# Patient Record
Sex: Female | Born: 1965 | Race: Black or African American | Hispanic: No | Marital: Married | Smoking: Never smoker
Health system: Southern US, Community
[De-identification: ages and names within clinical notes are randomized; demographics above are authoritative.]

## PROBLEM LIST (undated history)

## (undated) DIAGNOSIS — J40 Bronchitis, not specified as acute or chronic: Secondary | ICD-10-CM

## (undated) DIAGNOSIS — E0500 Thyrotoxicosis with diffuse goiter without thyrotoxic crisis or storm: Secondary | ICD-10-CM

## (undated) DIAGNOSIS — R0602 Shortness of breath: Secondary | ICD-10-CM

## (undated) DIAGNOSIS — E039 Hypothyroidism, unspecified: Secondary | ICD-10-CM

## (undated) DIAGNOSIS — J449 Chronic obstructive pulmonary disease, unspecified: Secondary | ICD-10-CM

## (undated) HISTORY — PX: ANKLE SURGERY: SHX546

---

## 2007-11-14 HISTORY — PX: APPENDECTOMY: SHX54

## 2007-11-14 HISTORY — PX: ABDOMINAL HYSTERECTOMY: SHX81

## 2008-12-20 ENCOUNTER — Emergency Department (HOSPITAL_BASED_OUTPATIENT_CLINIC_OR_DEPARTMENT_OTHER): Admission: EM | Admit: 2008-12-20 | Discharge: 2008-12-21 | Payer: Self-pay | Admitting: Emergency Medicine

## 2008-12-21 ENCOUNTER — Ambulatory Visit: Payer: Self-pay | Admitting: Diagnostic Radiology

## 2009-12-24 ENCOUNTER — Emergency Department (HOSPITAL_BASED_OUTPATIENT_CLINIC_OR_DEPARTMENT_OTHER): Admission: EM | Admit: 2009-12-24 | Discharge: 2009-12-24 | Payer: Self-pay | Admitting: Emergency Medicine

## 2010-11-06 ENCOUNTER — Emergency Department (HOSPITAL_BASED_OUTPATIENT_CLINIC_OR_DEPARTMENT_OTHER)
Admission: EM | Admit: 2010-11-06 | Discharge: 2010-11-06 | Payer: Self-pay | Source: Home / Self Care | Admitting: Emergency Medicine

## 2011-01-06 LAB — WOUND CULTURE

## 2011-01-23 LAB — POCT CARDIAC MARKERS
CKMB, poc: <1 ng/mL — ABNORMAL LOW (ref 1.0–8.0)
CKMB, poc: <1 ng/mL — ABNORMAL LOW (ref 1.0–8.0)
Myoglobin, poc: 62.3 ng/mL (ref 12–200)
Troponin i, poc: <0.05 ng/mL (ref 0.00–0.09)

## 2011-01-23 LAB — URINALYSIS, ROUTINE W REFLEX MICROSCOPIC
Hgb urine dipstick: NEGATIVE
Ketones, ur: 15 mg/dL — AB
Protein, ur: NEGATIVE mg/dL
Urobilinogen, UA: 1 mg/dL (ref 0.0–1.0)

## 2011-01-23 LAB — BASIC METABOLIC PANEL
Calcium: 9.1 mg/dL (ref 8.4–10.5)
GFR calc Af Amer: >60 mL/min (ref >60)
GFR calc non Af Amer: >60 mL/min (ref >60)
Glucose, Bld: 86 mg/dL (ref 70–99)
Sodium: 138 mEq/L (ref 135–145)

## 2011-01-23 LAB — URINE CULTURE

## 2011-09-07 ENCOUNTER — Emergency Department (HOSPITAL_BASED_OUTPATIENT_CLINIC_OR_DEPARTMENT_OTHER)
Admission: EM | Admit: 2011-09-07 | Discharge: 2011-09-07 | Disposition: A | Discharge status: Home / Self Care | Attending: Emergency Medicine | Admitting: Emergency Medicine

## 2011-09-07 ENCOUNTER — Encounter: Payer: Self-pay | Admitting: *Deleted

## 2011-09-07 DIAGNOSIS — J45909 Unspecified asthma, uncomplicated: Secondary | ICD-10-CM | POA: Insufficient documentation

## 2011-09-07 DIAGNOSIS — R0602 Shortness of breath: Secondary | ICD-10-CM | POA: Insufficient documentation

## 2011-09-07 DIAGNOSIS — J209 Acute bronchitis, unspecified: Secondary | ICD-10-CM | POA: Insufficient documentation

## 2011-09-07 HISTORY — DX: Thyrotoxicosis with diffuse goiter without thyrotoxic crisis or storm: E05.00

## 2011-09-07 MED ORDER — ALBUTEROL SULFATE (5 MG/ML) 0.5% IN NEBU
2.5000 mg | INHALATION_SOLUTION | Freq: Once | RESPIRATORY_TRACT | Status: AC
  Administered 2011-09-07: 2.5 mg via RESPIRATORY_TRACT

## 2011-09-07 MED ORDER — HYDROCOD POLST-CHLORPHEN POLST 10-8 MG/5ML PO LQCR: 5.0000 mL | Freq: Two times a day (BID) | ORAL | Status: DC | PRN

## 2011-09-07 MED ORDER — ALBUTEROL SULFATE (5 MG/ML) 0.5% IN NEBU
INHALATION_SOLUTION | RESPIRATORY_TRACT | Status: AC
  Filled 2011-09-07: qty 0.5

## 2011-09-07 MED ORDER — AZITHROMYCIN 250 MG PO TABS: 250.0000 mg | ORAL_TABLET | Freq: Every day | ORAL | Status: AC

## 2011-09-07 NOTE — ED Notes (Signed)
Pt c/o cough, congestion, SHOB since last Sat.

## 2011-09-07 NOTE — ED Provider Notes (Signed)
History  This chart was scribed for Geoffery Lyons, MD by Bennett Scrape. This patient was seen in room MH01/MH01 and the patient's care was started at 4:15PM.  CSN: [id number] Arrival date & time: 09/07/2011  4:10 PM   First MD Initiated Contact with Patient 09/07/11 1612      Chief Complaint  Patient presents with  . Cough    The history is provided by the patient. No language interpreter was used.   Maria Everett is a 45 y.o. female who presents to the Emergency Department complaining of a one week of a gradually worsening constant non-productive cough with associated mild nasal congestion and mild intermittent SOB. Pt has a h/o asthma and uses inhalers at home. Pt states that she used her inhaler with no improvement in the symptoms. She denies fever and chest pain as associated symptoms. Pt denies sick contacts at home. She denies smoking.   Past Medical History  Diagnosis Date  . Asthma   . Grave's disease     Past Surgical History  Procedure Date  . Abdominal hysterectomy   . Appendectomy     History reviewed. No pertinent family history.  History  Substance Use Topics  . Smoking status: Never Smoker   . Smokeless tobacco: Not on file  . Alcohol Use: No    Review of Systems A complete 10 system review of systems was obtained and is otherwise negative except as noted in the HPI.   Allergies  Review of patient's allergies indicates no known allergies.  Home Medications   Current Outpatient Rx  Name Route Sig Dispense Refill  . ALBUTEROL SULFATE HFA 108 (90 BASE) MCG/ACT IN AERS Inhalation Inhale 2 puffs into the lungs every 6 (six) hours as needed. For shortness of breath and wheezing     . FLUTICASONE-SALMETEROL 250-50 MCG/DOSE IN AEPB Inhalation Inhale 1 puff into the lungs every 12 (twelve) hours.      Marland Kitchen LEVOTHYROXINE SODIUM 175 MCG PO TABS Oral Take 175 mcg by mouth daily.        Triage Vitals: BP 125/80  Pulse 82  Temp(Src) 98.5 F (36.9 C) (Oral)   Resp 20  Ht 5\' 6"  (1.676 m)  Wt 187 lb (84.823 kg)  BMI 30.18 kg/m2  SpO2 97%  Physical Exam  Nursing note and vitals reviewed. Constitutional: She is oriented to person, place, and time. She appears well-developed and well-nourished.  HENT:  Head: Normocephalic and atraumatic.  Mouth/Throat: Oropharynx is clear and moist.       Mucus membranes are moist  Eyes: EOM are normal. Pupils are equal, round, and reactive to light.  Neck: Neck supple. No tracheal deviation present.  Cardiovascular: Normal rate and regular rhythm.   Pulmonary/Chest: Effort normal and breath sounds normal. No respiratory distress.  Abdominal: Soft. She exhibits no distension.  Musculoskeletal: Normal range of motion.  Lymphadenopathy:    She has no cervical adenopathy.  Neurological: She is alert and oriented to person, place, and time.  Skin: Skin is warm and dry.  Psychiatric: She has a normal mood and affect. Her behavior is normal.    ED Course  Procedures (including critical care time)  DIAGNOSTIC STUDIES: Oxygen Saturation is 97% on room air, adeqaute by my interpretation.    COORDINATION OF CARE: 4:17PM-Discussed treatment plan with patient at bedside and patient agreed to plan.   Labs Reviewed - No data to display No results found.   No diagnosis found.    MDM  Patient has uri/bronchitis.  Will treat with zmax, tussionex.       I personally performed the services described in this documentation, which was scribed in my presence. The recorded information has been reviewed and considered.      Geoffery Lyons, MD 09/07/11 2146

## 2011-10-19 ENCOUNTER — Emergency Department (INDEPENDENT_AMBULATORY_CARE_PROVIDER_SITE_OTHER)

## 2011-10-19 ENCOUNTER — Inpatient Hospital Stay (HOSPITAL_BASED_OUTPATIENT_CLINIC_OR_DEPARTMENT_OTHER)
Admission: EM | Admit: 2011-10-19 | Discharge: 2011-10-20 | DRG: 203 | Disposition: A | Discharge status: Home / Self Care | Source: Ambulatory Visit | Attending: Family Medicine | Admitting: Family Medicine

## 2011-10-19 ENCOUNTER — Other Ambulatory Visit: Payer: Self-pay

## 2011-10-19 ENCOUNTER — Encounter (HOSPITAL_BASED_OUTPATIENT_CLINIC_OR_DEPARTMENT_OTHER): Payer: Self-pay | Admitting: *Deleted

## 2011-10-19 DIAGNOSIS — Z23 Encounter for immunization: Secondary | ICD-10-CM

## 2011-10-19 DIAGNOSIS — R062 Wheezing: Secondary | ICD-10-CM

## 2011-10-19 DIAGNOSIS — IMO0002 Reserved for concepts with insufficient information to code with codable children: Secondary | ICD-10-CM

## 2011-10-19 DIAGNOSIS — J45901 Unspecified asthma with (acute) exacerbation: Principal | ICD-10-CM | POA: Diagnosis present

## 2011-10-19 DIAGNOSIS — E039 Hypothyroidism, unspecified: Secondary | ICD-10-CM | POA: Diagnosis present

## 2011-10-19 LAB — COMPREHENSIVE METABOLIC PANEL
Albumin: 4.2 g/dL (ref 3.5–5.2)
BUN: 11 mg/dL (ref 6–23)
Calcium: 9.8 mg/dL (ref 8.4–10.5)
Creatinine, Ser: 0.8 mg/dL (ref 0.50–1.10)
GFR calc Af Amer: >90 mL/min (ref >90)
Glucose, Bld: 114 mg/dL — ABNORMAL HIGH (ref 70–99)
Total Protein: 8.1 g/dL (ref 6.0–8.3)

## 2011-10-19 LAB — DIFFERENTIAL
Basophils Relative: 1 % (ref 0–1)
Eosinophils Absolute: 0.4 10*3/uL (ref 0.0–0.7)
Eosinophils Relative: 8 % — ABNORMAL HIGH (ref 0–5)
Lymphs Abs: 0.7 10*3/uL (ref 0.7–4.0)
Monocytes Absolute: 0.1 10*3/uL (ref 0.1–1.0)
Monocytes Relative: 2 % — ABNORMAL LOW (ref 3–12)
Neutrophils Relative %: 77 % (ref 43–77)

## 2011-10-19 LAB — CREATININE, SERUM: Creatinine, Ser: 0.65 mg/dL (ref 0.50–1.10)

## 2011-10-19 LAB — CBC
HCT: 39.3 % (ref 36.0–46.0)
Hemoglobin: 13.3 g/dL (ref 12.0–15.0)
MCH: 27.1 pg (ref 26.0–34.0)
MCH: 27.8 pg (ref 26.0–34.0)
MCHC: 33.8 g/dL (ref 30.0–36.0)
MCHC: 33.7 g/dL (ref 30.0–36.0)
MCV: 80.2 fL (ref 78.0–100.0)
MCV: 82.4 fL (ref 78.0–100.0)
Platelets: 311 10*3/uL (ref 150–400)
RBC: 4.72 MIL/uL (ref 3.87–5.11)
RDW: 12.6 % (ref 11.5–15.5)

## 2011-10-19 MED ORDER — ALBUTEROL SULFATE (5 MG/ML) 0.5% IN NEBU
INHALATION_SOLUTION | RESPIRATORY_TRACT | Status: AC
  Administered 2011-10-19: 2.5 mg via RESPIRATORY_TRACT
  Filled 2011-10-19: qty 0.5

## 2011-10-19 MED ORDER — PREDNISONE 50 MG PO TABS
60.0000 mg | ORAL_TABLET | Freq: Once | ORAL | Status: AC
  Administered 2011-10-19: 60 mg via ORAL

## 2011-10-19 MED ORDER — ENOXAPARIN SODIUM 40 MG/0.4ML ~~LOC~~ SOLN
40.0000 mg | SUBCUTANEOUS | Status: DC
  Administered 2011-10-19 – 2011-10-20 (×2): 40 mg via SUBCUTANEOUS
  Filled 2011-10-19 (×2): qty 0.4

## 2011-10-19 MED ORDER — FLUTICASONE-SALMETEROL 250-50 MCG/DOSE IN AEPB
1.0000 | INHALATION_SPRAY | Freq: Two times a day (BID) | RESPIRATORY_TRACT | Status: DC
  Administered 2011-10-19 – 2011-10-20 (×2): 1 via RESPIRATORY_TRACT
  Filled 2011-10-19: qty 14

## 2011-10-19 MED ORDER — SODIUM CHLORIDE 0.9 % IV BOLUS (SEPSIS): 500.0000 mL | Freq: Once | INTRAVENOUS | Status: DC

## 2011-10-19 MED ORDER — PREDNISONE 50 MG PO TABS
ORAL_TABLET | ORAL | Status: AC
  Filled 2011-10-19: qty 1

## 2011-10-19 MED ORDER — IPRATROPIUM BROMIDE 0.02 % IN SOLN: 0.5000 mg | Freq: Four times a day (QID) | RESPIRATORY_TRACT | Status: DC

## 2011-10-19 MED ORDER — ALBUTEROL (5 MG/ML) CONTINUOUS INHALATION SOLN
10.0000 mg/h | INHALATION_SOLUTION | RESPIRATORY_TRACT | Status: DC
  Filled 2011-10-19: qty 20

## 2011-10-19 MED ORDER — ALBUTEROL SULFATE (5 MG/ML) 0.5% IN NEBU
5.0000 mg | INHALATION_SOLUTION | Freq: Once | RESPIRATORY_TRACT | Status: AC
  Administered 2011-10-19: 5 mg via RESPIRATORY_TRACT

## 2011-10-19 MED ORDER — IPRATROPIUM BROMIDE 0.02 % IN SOLN
0.5000 mg | Freq: Once | RESPIRATORY_TRACT | Status: AC
  Administered 2011-10-19: 0.5 mg via RESPIRATORY_TRACT

## 2011-10-19 MED ORDER — LORAZEPAM 2 MG/ML IJ SOLN
0.5000 mg | Freq: Once | INTRAMUSCULAR | Status: AC
  Administered 2011-10-19: 0.5 mg via INTRAVENOUS

## 2011-10-19 MED ORDER — ALBUTEROL SULFATE (5 MG/ML) 0.5% IN NEBU
INHALATION_SOLUTION | RESPIRATORY_TRACT | Status: AC
  Filled 2011-10-19: qty 0.5

## 2011-10-19 MED ORDER — GUAIFENESIN 100 MG/5ML PO SOLN
5.0000 mL | ORAL | Status: DC | PRN
  Administered 2011-10-19: 100 mg via ORAL
  Filled 2011-10-19 (×2): qty 5

## 2011-10-19 MED ORDER — PREDNISONE 50 MG PO TABS
50.0000 mg | ORAL_TABLET | Freq: Every day | ORAL | Status: DC
  Administered 2011-10-20: 50 mg via ORAL
  Filled 2011-10-19 (×2): qty 1

## 2011-10-19 MED ORDER — IBUPROFEN 800 MG PO TABS
800.0000 mg | ORAL_TABLET | Freq: Once | ORAL | Status: AC
  Administered 2011-10-19: 800 mg via ORAL

## 2011-10-19 MED ORDER — IPRATROPIUM BROMIDE 0.02 % IN SOLN
0.5000 mg | RESPIRATORY_TRACT | Status: DC
  Administered 2011-10-19 – 2011-10-20 (×6): 0.5 mg via RESPIRATORY_TRACT
  Filled 2011-10-19 (×6): qty 2.5

## 2011-10-19 MED ORDER — LORAZEPAM 2 MG/ML IJ SOLN
INTRAMUSCULAR | Status: AC
  Administered 2011-10-19: 0.5 mg via INTRAVENOUS
  Filled 2011-10-19: qty 1

## 2011-10-19 MED ORDER — ALBUTEROL SULFATE (5 MG/ML) 0.5% IN NEBU
2.5000 mg | INHALATION_SOLUTION | RESPIRATORY_TRACT | Status: DC
  Administered 2011-10-19 – 2011-10-20 (×6): 2.5 mg via RESPIRATORY_TRACT
  Filled 2011-10-19 (×6): qty 0.5

## 2011-10-19 MED ORDER — PNEUMOCOCCAL VAC POLYVALENT 25 MCG/0.5ML IJ INJ
0.5000 mL | INJECTION | INTRAMUSCULAR | Status: AC
  Administered 2011-10-20: 0.5 mL via INTRAMUSCULAR
  Filled 2011-10-19: qty 0.5

## 2011-10-19 MED ORDER — IPRATROPIUM BROMIDE 0.02 % IN SOLN
0.5000 mg | RESPIRATORY_TRACT | Status: DC | PRN
  Administered 2011-10-19 (×2): 0.5 mg via RESPIRATORY_TRACT
  Filled 2011-10-19 (×2): qty 2.5

## 2011-10-19 MED ORDER — PREDNISONE 10 MG PO TABS
ORAL_TABLET | ORAL | Status: AC
  Filled 2011-10-19: qty 1

## 2011-10-19 MED ORDER — ALBUTEROL SULFATE (5 MG/ML) 0.5% IN NEBU
10.0000 mg | INHALATION_SOLUTION | Freq: Once | RESPIRATORY_TRACT | Status: AC
  Administered 2011-10-19: 10 mg via RESPIRATORY_TRACT

## 2011-10-19 MED ORDER — IBUPROFEN 800 MG PO TABS
ORAL_TABLET | ORAL | Status: AC
  Administered 2011-10-19: 800 mg via ORAL
  Filled 2011-10-19: qty 1

## 2011-10-19 MED ORDER — LEVOTHYROXINE SODIUM 175 MCG PO TABS
175.0000 ug | ORAL_TABLET | Freq: Every day | ORAL | Status: DC
  Administered 2011-10-19 – 2011-10-20 (×2): 175 ug via ORAL
  Filled 2011-10-19 (×2): qty 1

## 2011-10-19 MED ORDER — ALBUTEROL SULFATE (5 MG/ML) 0.5% IN NEBU
2.5000 mg | INHALATION_SOLUTION | RESPIRATORY_TRACT | Status: DC | PRN
  Administered 2011-10-19 (×3): 2.5 mg via RESPIRATORY_TRACT
  Filled 2011-10-19 (×2): qty 0.5

## 2011-10-19 NOTE — H&P (Signed)
PCP:   Jackie Plum, MD, MD   Chief Complaint:  Shortness of breath  HPI: 5 female with a history of asthma who came to [facility] with the chief complaint of shortness of breath for 2to 3 days. Patient says that usually she takes albuterol rescue inhaler 2-3 times a week and also takes Advair puff twice a day but she was not getting better so she came to the hospital. Patient also complains of runny nose going on for past 10 days since  Christmas. She denies any chest pain no nausea vomiting or diarrhea.  Allergies:  No Known Allergies    Past Medical History  Diagnosis Date  . Asthma   . Grave's disease     Past Surgical History  Procedure Date  . Abdominal hysterectomy   . Appendectomy     Prior to Admission medications   Medication Sig Start Date End Date Taking? Authorizing Provider  albuterol (PROVENTIL HFA;VENTOLIN HFA) 108 (90 BASE) MCG/ACT inhaler Inhale 2 puffs into the lungs every 6 (six) hours as needed. For shortness of breath and wheezing     Historical Provider, MD  chlorpheniramine-HYDROcodone (TUSSIONEX PENNKINETIC ER) 10-8 MG/5ML LQCR Take 5 mLs by mouth every 12 (twelve) hours as needed. 09/07/11   Geoffery Lyons, MD  Fluticasone-Salmeterol (ADVAIR) 250-50 MCG/DOSE AEPB Inhale 1 puff into the lungs every 12 (twelve) hours.      Historical Provider, MD  levothyroxine (SYNTHROID, LEVOTHROID) 175 MCG tablet Take 175 mcg by mouth daily.      Historical Provider, MD    Social History:  reports that she has never smoked. She does not have any smokeless tobacco history on file. She reports that she does not drink alcohol or use illicit drugs.  History reviewed. No pertinent family history.  Review of Systems:  Constitutional: Denies fever, chills, diaphoresis, appetite change and fatigue.  HEENT: Denies photophobia, eye pain, redness, hearing loss, ear pain, congestion, sore throat,positive rhinorrhea, sneezing. Respiratory: See  HPI Cardiovascular: Denies chest pain, palpitations and leg swelling.  Gastrointestinal: Denies nausea, vomiting, abdominal pain, diarrhea, constipation, blood in stool and abdominal distention.  Genitourinary: Denies dysuria, urgency, frequency, hematuria, flank pain and difficulty urinating.  Neurological: Denies dizziness, seizures, syncope, weakness, light-headedness, numbness and headaches.  Hematological: Denies adenopathy. Easy bruising, personal or family bleeding history     Physical Exam: Blood pressure 123/73, pulse 87, temperature 98.1 F (36.7 C), temperature source Oral, resp. rate 20, SpO2 97.00%. Constitutional: Vital signs reviewed.  Patient is a well-developed and well-nourished female in no acute distress and cooperative with exam. Alert and oriented x3.  Head: Normocephalic and atraumatic Mouth: no erythema or exudates, MMM Eyes: PERRL, EOMI, conjunctivae normal, No scleral icterus.  Neck: Supple, Trachea midline normal ROM, No JVD, mass, thyromegaly, or carotid bruit present.  Cardiovascular: RRR, S1 normal, S2 normal, no MRG, pulses symmetric and intact bilaterally Pulmonary/Chest: Bilateral wheezing Abdominal: Soft. Non-tender, non-distended, bowel sounds are normal, no masses, organomegaly, or guarding present.  Neurological: A&O x3, Strenght is normal and symmetric bilaterally, cranial nerve II-XII are grossly intact, no focal motor deficit, sensory intact to light touch bilaterally.  Skin: Warm, dry and intact. No rash, cyanosis, or clubbing.     Labs on Admission:  Results for orders placed during the hospital encounter of 10/19/11 (from the past 48 hour(s))  CBC     Status: Normal   Collection Time   10/19/11 10:10 AM      Component Value Range Comment   WBC  5.2  4.0 - 10.5 (K/uL)    RBC 4.90  3.87 - 5.11 (MIL/uL)    Hemoglobin 13.3  12.0 - 15.0 (g/dL)    HCT 16.1  09.6 - 04.5 (%)    MCV 80.2  78.0 - 100.0 (fL)    MCH 27.1  26.0 - 34.0 (pg)    MCHC 33.8   30.0 - 36.0 (g/dL)    RDW 40.9  81.1 - 91.4 (%)    Platelets 331  150 - 400 (K/uL)   DIFFERENTIAL     Status: Abnormal   Collection Time   10/19/11 10:10 AM      Component Value Range Comment   Neutrophils Relative 77  43 - 77 (%)    Neutro Abs 4.0  1.7 - 7.7 (K/uL)    Lymphocytes Relative 14  12 - 46 (%)    Lymphs Abs 0.7  0.7 - 4.0 (K/uL)    Monocytes Relative 2 (*) 3 - 12 (%)    Monocytes Absolute 0.1  0.1 - 1.0 (K/uL)    Eosinophils Relative 8 (*) 0 - 5 (%)    Eosinophils Absolute 0.4  0.0 - 0.7 (K/uL)    Basophils Relative 1  0 - 1 (%)    Basophils Absolute 0.0  0.0 - 0.1 (K/uL)   COMPREHENSIVE METABOLIC PANEL     Status: Abnormal   Collection Time   10/19/11 10:10 AM      Component Value Range Comment   Sodium 139  135 - 145 (mEq/L)    Potassium 4.0  3.5 - 5.1 (mEq/L)    Chloride 104  96 - 112 (mEq/L)    CO2 24  19 - 32 (mEq/L)    Glucose, Bld 114 (*) 70 - 99 (mg/dL)    BUN 11  6 - 23 (mg/dL)    Creatinine, Ser 7.82  0.50 - 1.10 (mg/dL)    Calcium 9.8  8.4 - 10.5 (mg/dL)    Total Protein 8.1  6.0 - 8.3 (g/dL)    Albumin 4.2  3.5 - 5.2 (g/dL)    AST 23  0 - 37 (U/L)    ALT 15  0 - 35 (U/L)    Alkaline Phosphatase 62  39 - 117 (U/L)    Total Bilirubin 0.8  0.3 - 1.2 (mg/dL)    GFR calc non Af Amer 89 (*) >90 (mL/min)    GFR calc Af Amer >90  >90 (mL/min)     Radiological Exams on Admission: Dg Chest Portable 1 View  10/19/2011  *RADIOLOGY REPORT*  Clinical Data: Wheezing  PORTABLE CHEST - 1 VIEW  Comparison: 12/21/2008  Findings: Lungs are clear. No pleural effusion or pneumothorax.  Cardiomediastinal silhouette is within normal limits.  IMPRESSION: No evidence of acute cardiopulmonary disease.  Original Report Authenticated By: Charline Bills, M.D.    Assessment/Plan Asthma exacerbation: Start Duoneb nebulizer q 2 hr PRN Prednisone 50 mg po daily.  Hypothyroidism: Cont synthroid.   Time Spent on Admission: 35 min  Linetta Regner S [facility] Pager:  [phone] 10/19/2011, 1:48 PM

## 2011-10-19 NOTE — ED Notes (Signed)
Pt states she feels like her throat is closing.  Throat is red but open.  No swelling noted.  Lungs note exp wheezes.  No acute respiratory distress noted.  Pt tearful.  Dr Rosalia Hammers notified, orders received.

## 2011-10-19 NOTE — ED Provider Notes (Addendum)
History     CSN: [id number]  Arrival date & time 10/19/11  1191   First MD Initiated Contact with Patient 10/19/11 [phone]      Chief Complaint  Patient presents with  . Shortness of Breath   Patient with history of asthma, uri symptoms , cough and wheezing.  Using albuterol bid and feels worsening. No current fever, some productive cough.  Patient had albuterol 5/atrovent 0.5 prte and currently receiving albuterol 10mg /hr.   (Consider location/radiation/quality/duration/timing/severity/associated sxs/prior treatment) HPI  Past Medical History  Diagnosis Date  . Asthma   . Grave's disease     Past Surgical History  Procedure Date  . Abdominal hysterectomy   . Appendectomy     History reviewed. No pertinent family history.  History  Substance Use Topics  . Smoking status: Never Smoker   . Smokeless tobacco: Not on file  . Alcohol Use: No    OB History    Grav Para Term Preterm Abortions TAB SAB Ect Mult Living                  Review of Systems  Unable to perform ROS   Allergies  Review of patient's allergies indicates no known allergies.  Home Medications   Current Outpatient Rx  Name Route Sig Dispense Refill  . ALBUTEROL SULFATE HFA 108 (90 BASE) MCG/ACT IN AERS Inhalation Inhale 2 puffs into the lungs every 6 (six) hours as needed. For shortness of breath and wheezing     . HYDROCOD POLST-CPM POLST ER 10-8 MG/5ML PO LQCR Oral Take 5 mLs by mouth every 12 (twelve) hours as needed. 100 mL 0  . FLUTICASONE-SALMETEROL 250-50 MCG/DOSE IN AEPB Inhalation Inhale 1 puff into the lungs every 12 (twelve) hours.      Marland Kitchen LEVOTHYROXINE SODIUM 175 MCG PO TABS Oral Take 175 mcg by mouth daily.        BP 116/72  Pulse 86  Temp(Src) 97.5 F (36.4 C) (Oral)  Resp 20  SpO2 99%  Physical Exam  Nursing note and vitals reviewed. Constitutional: She is oriented to person, place, and time. She appears well-developed and well-nourished.  HENT:  Head: Normocephalic and  atraumatic.  Right Ear: External ear normal.  Left Ear: External ear normal.  Nose: Nose normal.  Mouth/Throat: Oropharynx is clear and moist.  Eyes: Pupils are equal, round, and reactive to light.  Neck: Normal range of motion.  Cardiovascular: Normal rate and regular rhythm.   Pulmonary/Chest: She has wheezes.  Abdominal: Soft. Bowel sounds are normal.  Musculoskeletal: Normal range of motion.  Neurological: She is alert and oriented to person, place, and time. She has normal reflexes.  Skin: Skin is warm and dry.  Psychiatric: She has a normal mood and affect.    ED Course  Procedures (including critical care time)  Labs Reviewed - No data to display Dg Chest Portable 1 View  10/19/2011  *RADIOLOGY REPORT*  Clinical Data: Wheezing  PORTABLE CHEST - 1 VIEW  Comparison: 12/21/2008  Findings: Lungs are clear. No pleural effusion or pneumothorax.  Cardiomediastinal silhouette is within normal limits.  IMPRESSION: No evidence of acute cardiopulmonary disease.  Original Report Authenticated By: Charline Bills, M.D.     No diagnosis found.   Date: 10/19/2011  Rate: 97  Rhythm: normal sinus rhythm  QRS Axis: normal  Intervals: normal  ST/T Wave abnormalities: diffuse t wave abnormalities  Conduction Disutrbances:none  Narrative Interpretation:   Old EKG Reviewed: no change from 12/21/08    Patient received  albuterol 5 mg with Atrovent prior to my evaluation. She was receiving a 10 mg albuterol over an hour when I saw her. Upon reevaluation after that she continued to have diffuse expiratory wheezing and increased work of breathing. She received prednisone 60 mg by mouth and another 10 mg albuterol nebulizer over an hour. She continues to have expiratory wheezing and to be tachypneic. She is having 1 L of normal saline infused. Labs are pending. Chest x-Nohelia Valenza does not show any evidence of acute infiltrate   Plan continued nebulizer treatments, IV fluids, continued monitoring and  observation. The hospitalists has been paged for admission    Hilario Quarry, MD 10/19/11 1058  Hilario Quarry, MD 10/19/11 [phone]

## 2011-10-19 NOTE — ED Notes (Signed)
Boneta Lucks, RT at bedside, starting 1 hr long albuterol tx for persistent wheezing.  Pt states that she has been sick intermittently since thanksgiving with respiratory infections.  Pt has been treating breathing issues at home with advair and albuterol.

## 2011-10-19 NOTE — ED Notes (Signed)
Pt with cold sx x one week with progressively worsening SOB and wheezing x 3 days

## 2011-10-20 LAB — COMPREHENSIVE METABOLIC PANEL
ALT: 15 U/L (ref 0–35)
Alkaline Phosphatase: 54 U/L (ref 39–117)
BUN: 11 mg/dL (ref 6–23)
CO2: 25 mEq/L (ref 19–32)
Chloride: 105 mEq/L (ref 96–112)
GFR calc Af Amer: >90 mL/min (ref >90)
GFR calc non Af Amer: >90 mL/min (ref >90)
Glucose, Bld: 91 mg/dL (ref 70–99)
Potassium: 3.8 mEq/L (ref 3.5–5.1)
Sodium: 141 mEq/L (ref 135–145)
Total Bilirubin: 0.7 mg/dL (ref 0.3–1.2)
Total Protein: 7.5 g/dL (ref 6.0–8.3)

## 2011-10-20 LAB — CBC
HCT: 37.3 % (ref 36.0–46.0)
MCHC: 33.2 g/dL (ref 30.0–36.0)
Platelets: 307 10*3/uL (ref 150–400)
RDW: 12.8 % (ref 11.5–15.5)
WBC: 10 10*3/uL (ref 4.0–10.5)

## 2011-10-20 MED ORDER — ACETAMINOPHEN 500 MG PO TABS
500.0000 mg | ORAL_TABLET | ORAL | Status: DC | PRN
  Administered 2011-10-20: 500 mg via ORAL
  Filled 2011-10-20: qty 1

## 2011-10-20 MED ORDER — ALBUTEROL SULFATE (5 MG/ML) 0.5% IN NEBU: 2.5000 mg | INHALATION_SOLUTION | RESPIRATORY_TRACT | Status: DC | PRN

## 2011-10-20 MED ORDER — PREDNISONE 50 MG PO TABS: ORAL_TABLET | ORAL | Status: DC

## 2011-10-20 NOTE — Progress Notes (Signed)
   CARE MANAGEMENT NOTE 10/20/2011  Patient:  POWELL,Yasmene   Account Number:  [id number]  Date Initiated:  10/20/2011  Documentation initiated by:  Junius Creamer  Subjective/Objective Assessment:   adm w asthma     Action/Plan:   lives w fam, pcp dr Dannial Monarch bonsu   Anticipated DC Date:  10/20/2011   Anticipated DC Plan:  HOME/SELF CARE      DC Planning Services  CM consult      PAC Choice  DURABLE MEDICAL EQUIPMENT   Choice offered to / List presented to:  C-1 Patient   DME arranged  NEBULIZER MACHINE      DME agency  [facility]        Status of service:   Medicare Important Message given?   (If response is "NO", the following Medicare IM given date fields will be blank) Date Medicare IM given:   Date Additional Medicare IM given:    Discharge Disposition:  HOME/SELF CARE  Per UR Regulation:  Reviewed for med. necessity/level of care/duration of stay  Comments:  1/7 spoke w pt, agreeable for home neb, she knows to pick up med at pharmacy. ahc to del neb to room and will show pt how to use. debbie Gracen [place] rn,bsn [id number]

## 2011-10-20 NOTE — Progress Notes (Signed)
Utilization Review Completed.  Maria Everett T  10/20/2011 

## 2011-10-20 NOTE — Progress Notes (Addendum)
Discharge instructions reviewed with pt and prescription given.  Nebulizer has been delivered to room and pt states she was shown how to use it.  Pt verbalized understanding and had no questions.  Pt discharged in stable condition via wheelchair with family.  Hector Shade [place]

## 2011-10-20 NOTE — Progress Notes (Signed)
Maria Everett, 46 y.o., DOB 02/14/1966, MRN [id number]. Admission date: 10/19/2011 Discharge Date 10/20/2011 Primary MD Jackie Plum, MD, MD Admitting Physician Meredeth Ide  Admission Diagnosis  Asthma attack [493.90] sob..hx of asthma  Discharge Diagnosis   Active Problems:  * No active hospital problems. *    Past Medical History  Diagnosis Date  . Asthma   . Grave's disease     Past Surgical History  Procedure Date  . Appendectomy   . Abdominal hysterectomy     Brief History and physical 78 female with a history of asthma who came to [facility] with the chief complaint of shortness of breath for 2to 3 days. Patient says that usually she takes albuterol rescue inhaler 2-3 times a week and also takes Advair puff twice a day but she was not getting better so she came to the hospital. Patient also complains of runny nose going on for past 10 days since Christmas. She denies any chest pain no nausea vomiting or diarrhea.   Hospital Course   Asthma: Patient was admitted with asthma, started on albuterol nebulizer, prednisone therapy. Asthma is now resolved. Will send on Nebulizer.  Hypothyroidism Continue Synthroid.      Significant Tests:  See full reports for all details    Dg Chest Portable 1 View  10/19/2011  *RADIOLOGY REPORT*  Clinical Data: Wheezing  PORTABLE CHEST - 1 VIEW  Comparison: 12/21/2008  Findings: Lungs are clear. No pleural effusion or pneumothorax.  Cardiomediastinal silhouette is within normal limits.  IMPRESSION: No evidence of acute cardiopulmonary disease.  Original Report Authenticated By: Charline Bills, M.D.     Today   Subjective:   Maria Everett today has no headache,no chest abdominal pain, no shortness of breath. Objective:   Blood pressure 107/68, pulse 85, temperature 98.1 F (36.7 C), temperature source Oral, resp. rate 19, height 5\' 5"  (1.651 m), weight 84.823 kg (187 lb), SpO2 93.00%. No intake or output data  in the 24 hours ending 10/20/11 1524  Exam Awake Alert, Oriented *3, No new F.N deficits, Normal affect Burke.AT,PERRAL Supple Neck,No JVD, No cervical lymphadenopathy appriciated.  Symmetrical Chest wall movement, scattered wheezing. RRR,No Gallops,Rubs or new Murmurs, No Parasternal Heave +ve B.Sounds, Abd Soft, Non tender, No organomegaly appriciated, No rebound -guarding or rigidity. No Cyanosis, Clubbing or edema, No new Rash or bruise  Data Review     CBC w Diff:  Lab Results  Component Value Date   WBC 10.0 10/20/2011   HGB 12.4 10/20/2011   HCT 37.3 10/20/2011   PLT 307 10/20/2011   LYMPHOPCT 14 10/19/2011   MONOPCT 2* 10/19/2011   EOSPCT 8* 10/19/2011   BASOPCT 1 10/19/2011   CMP:  Lab Results  Component Value Date   NA 141 10/20/2011   K 3.8 10/20/2011   CL 105 10/20/2011   CO2 25 10/20/2011   BUN 11 10/20/2011   CREATININE 0.73 10/20/2011   PROT 7.5 10/20/2011   ALBUMIN 3.4* 10/20/2011   BILITOT 0.7 10/20/2011   ALKPHOS 54 10/20/2011   AST 19 10/20/2011   ALT 15 10/20/2011  .    Discharge Instructions      Follow-up Information    Follow up with OSEI-BONSU,GEORGE, MD .         Discharge Medications   Medication List  As of 10/20/2011  3:24 PM   START taking these medications         * albuterol (5 MG/ML) 0.5% nebulizer solution   Commonly known  as: PROVENTIL   Take 0.5 mLs (2.5 mg total) by nebulization every 4 (four) hours as needed for wheezing.      predniSONE 50 MG tablet   Commonly known as: DELTASONE   Prednisone 50 mg po daily x 1 day, Prednisone 40 mg po daily x 1 day, Prednisone 30 mg po daily x 1 day, Prednisone 20 mg po daily x 1 day then Prednisone 10 mg po daily x 1 day then stop...         * Notice: This list has 1 medication(s) that are the same as other medications prescribed for you. Read the directions carefully, and ask your doctor or other care provider to review them with you.       CONTINUE taking these medications         * albuterol 108 (90 BASE)  MCG/ACT inhaler   Commonly known as: PROVENTIL HFA;VENTOLIN HFA      Fluticasone-Salmeterol 250-50 MCG/DOSE Aepb   Commonly known as: ADVAIR      levothyroxine 175 MCG tablet   Commonly known as: SYNTHROID, LEVOTHROID     * Notice: This list has 1 medication(s) that are the same as other medications prescribed for you. Read the directions carefully, and ask your doctor or other care provider to review them with you.       STOP taking these medications         chlorpheniramine-HYDROcodone 10-8 MG/5ML Lqcr          Where to get your medications    These are the prescriptions that you need to pick up. We sent them to a specific pharmacy, so you will need to go there to get them.   [id number] - [place], [place] - [address], [address] AT [facility]    [address], [address] [place] [address]    Phone: [phone]        albuterol (5 MG/ML) 0.5% nebulizer solution         You may get these medications from any pharmacy.         predniSONE 50 MG tablet             Total Time in preparing paper work, data evaluation and todays exam - 35 minutes  Teshaun Olarte S M.D on 10/20/2011 at 3:24 PM  [facility] Office  [phone]

## 2011-10-22 ENCOUNTER — Inpatient Hospital Stay (HOSPITAL_BASED_OUTPATIENT_CLINIC_OR_DEPARTMENT_OTHER)
Admission: EM | Admit: 2011-10-22 | Discharge: 2011-10-25 | DRG: 203 | Disposition: A | Discharge status: Home / Self Care | Source: Ambulatory Visit | Attending: Internal Medicine | Admitting: Internal Medicine

## 2011-10-22 ENCOUNTER — Inpatient Hospital Stay (HOSPITAL_COMMUNITY)

## 2011-10-22 ENCOUNTER — Encounter (HOSPITAL_BASED_OUTPATIENT_CLINIC_OR_DEPARTMENT_OTHER): Payer: Self-pay

## 2011-10-22 DIAGNOSIS — J45909 Unspecified asthma, uncomplicated: Secondary | ICD-10-CM | POA: Diagnosis present

## 2011-10-22 DIAGNOSIS — Z9119 Patient's noncompliance with other medical treatment and regimen: Secondary | ICD-10-CM

## 2011-10-22 DIAGNOSIS — E0500 Thyrotoxicosis with diffuse goiter without thyrotoxic crisis or storm: Secondary | ICD-10-CM | POA: Insufficient documentation

## 2011-10-22 DIAGNOSIS — Z91199 Patient's noncompliance with other medical treatment and regimen due to unspecified reason: Secondary | ICD-10-CM

## 2011-10-22 DIAGNOSIS — Z79899 Other long term (current) drug therapy: Secondary | ICD-10-CM

## 2011-10-22 DIAGNOSIS — J45901 Unspecified asthma with (acute) exacerbation: Principal | ICD-10-CM | POA: Diagnosis present

## 2011-10-22 DIAGNOSIS — E079 Disorder of thyroid, unspecified: Secondary | ICD-10-CM | POA: Diagnosis present

## 2011-10-22 DIAGNOSIS — R0602 Shortness of breath: Secondary | ICD-10-CM

## 2011-10-22 DIAGNOSIS — Z6829 Body mass index (BMI) 29.0-29.9, adult: Secondary | ICD-10-CM

## 2011-10-22 HISTORY — DX: Bronchitis, not specified as acute or chronic: J40

## 2011-10-22 HISTORY — DX: Shortness of breath: R06.02

## 2011-10-22 HISTORY — DX: Hypothyroidism, unspecified: E03.9

## 2011-10-22 LAB — COMPREHENSIVE METABOLIC PANEL
ALT: 17 U/L (ref 0–35)
Alkaline Phosphatase: 61 U/L (ref 39–117)
CO2: 20 mEq/L (ref 19–32)
Calcium: 9.7 mg/dL (ref 8.4–10.5)
Chloride: 105 mEq/L (ref 96–112)
GFR calc Af Amer: >90 mL/min (ref >90)
GFR calc non Af Amer: >90 mL/min (ref >90)
Glucose, Bld: 129 mg/dL — ABNORMAL HIGH (ref 70–99)
Potassium: 3.9 mEq/L (ref 3.5–5.1)
Sodium: 140 mEq/L (ref 135–145)
Total Bilirubin: 0.5 mg/dL (ref 0.3–1.2)

## 2011-10-22 LAB — CBC
Hemoglobin: 13.1 g/dL (ref 12.0–15.0)
MCH: 27.8 pg (ref 26.0–34.0)
RBC: 4.72 MIL/uL (ref 3.87–5.11)

## 2011-10-22 MED ORDER — GUAIFENESIN-DM 100-10 MG/5ML PO SYRP: 5.0000 mL | ORAL_SOLUTION | ORAL | Status: DC | PRN

## 2011-10-22 MED ORDER — ONDANSETRON HCL 4 MG/2ML IJ SOLN: 4.0000 mg | Freq: Four times a day (QID) | INTRAMUSCULAR | Status: DC | PRN

## 2011-10-22 MED ORDER — DM-GUAIFENESIN ER 30-600 MG PO TB12
1.0000 | ORAL_TABLET | Freq: Two times a day (BID) | ORAL | Status: DC
  Administered 2011-10-22 – 2011-10-25 (×6): 1 via ORAL
  Filled 2011-10-22 (×7): qty 1

## 2011-10-22 MED ORDER — ALBUTEROL (5 MG/ML) CONTINUOUS INHALATION SOLN
10.0000 mg/h | INHALATION_SOLUTION | RESPIRATORY_TRACT | Status: DC
  Administered 2011-10-22: 11:00:00 via RESPIRATORY_TRACT
  Administered 2011-10-22: 10 mg/h via RESPIRATORY_TRACT
  Administered 2011-10-22: 11:00:00 via RESPIRATORY_TRACT
  Filled 2011-10-22: qty 20

## 2011-10-22 MED ORDER — GUAIFENESIN-DM 100-10 MG/5ML PO SYRP
5.0000 mL | ORAL_SOLUTION | ORAL | Status: DC | PRN
  Administered 2011-10-22 – 2011-10-25 (×2): 5 mL via ORAL
  Filled 2011-10-22 (×2): qty 5

## 2011-10-22 MED ORDER — ALBUTEROL SULFATE (5 MG/ML) 0.5% IN NEBU
INHALATION_SOLUTION | RESPIRATORY_TRACT | Status: AC
  Filled 2011-10-22: qty 0.5

## 2011-10-22 MED ORDER — ACETAMINOPHEN 325 MG PO TABS: 650.0000 mg | ORAL_TABLET | Freq: Four times a day (QID) | ORAL | Status: DC | PRN

## 2011-10-22 MED ORDER — METHYLPREDNISOLONE SODIUM SUCC 125 MG IJ SOLR
60.0000 mg | Freq: Three times a day (TID) | INTRAMUSCULAR | Status: DC
  Administered 2011-10-22 – 2011-10-24 (×5): 60 mg via INTRAVENOUS
  Filled 2011-10-22: qty 0.96
  Filled 2011-10-22: qty 2
  Filled 2011-10-22 (×2): qty 0.96
  Filled 2011-10-22: qty 2
  Filled 2011-10-22 (×3): qty 0.96

## 2011-10-22 MED ORDER — ALBUTEROL SULFATE (5 MG/ML) 0.5% IN NEBU
2.5000 mg | INHALATION_SOLUTION | Freq: Four times a day (QID) | RESPIRATORY_TRACT | Status: DC
  Administered 2011-10-23 (×3): 2.5 mg via RESPIRATORY_TRACT
  Filled 2011-10-22 (×3): qty 0.5

## 2011-10-22 MED ORDER — PREDNISONE 10 MG PO TABS
10.0000 mg | ORAL_TABLET | Freq: Every day | ORAL | Status: DC
  Administered 2011-10-22 – 2011-10-23 (×2): 10 mg via ORAL
  Filled 2011-10-22 (×3): qty 1

## 2011-10-22 MED ORDER — ERGOCALCIFEROL 1.25 MG (50000 UT) PO CAPS
50000.0000 [IU] | ORAL_CAPSULE | ORAL | Status: DC
  Administered 2011-10-23: 50000 [IU] via ORAL
  Filled 2011-10-22: qty 1

## 2011-10-22 MED ORDER — ALBUTEROL SULFATE (5 MG/ML) 0.5% IN NEBU
INHALATION_SOLUTION | RESPIRATORY_TRACT | Status: AC
  Filled 2011-10-22: qty 3

## 2011-10-22 MED ORDER — IPRATROPIUM BROMIDE 0.02 % IN SOLN
RESPIRATORY_TRACT | Status: AC
  Administered 2011-10-22: 11:00:00
  Filled 2011-10-22: qty 2.5

## 2011-10-22 MED ORDER — LEVOTHYROXINE SODIUM 175 MCG PO TABS
175.0000 ug | ORAL_TABLET | Freq: Every day | ORAL | Status: DC
  Administered 2011-10-23 – 2011-10-25 (×3): 175 ug via ORAL
  Filled 2011-10-22 (×4): qty 1

## 2011-10-22 MED ORDER — IPRATROPIUM BROMIDE 0.02 % IN SOLN
0.5000 mg | Freq: Four times a day (QID) | RESPIRATORY_TRACT | Status: DC
  Administered 2011-10-23 (×3): 0.5 mg via RESPIRATORY_TRACT
  Filled 2011-10-22 (×3): qty 2.5

## 2011-10-22 MED ORDER — ENOXAPARIN SODIUM 40 MG/0.4ML ~~LOC~~ SOLN
40.0000 mg | SUBCUTANEOUS | Status: DC
  Administered 2011-10-22 – 2011-10-24 (×3): 40 mg via SUBCUTANEOUS
  Filled 2011-10-22 (×4): qty 0.4

## 2011-10-22 MED ORDER — BISACODYL 5 MG PO TBEC: 5.0000 mg | DELAYED_RELEASE_TABLET | Freq: Every day | ORAL | Status: DC | PRN

## 2011-10-22 MED ORDER — FLUTICASONE-SALMETEROL 250-50 MCG/DOSE IN AEPB
1.0000 | INHALATION_SPRAY | Freq: Two times a day (BID) | RESPIRATORY_TRACT | Status: DC
  Administered 2011-10-22 – 2011-10-25 (×6): 1 via RESPIRATORY_TRACT
  Filled 2011-10-22: qty 14

## 2011-10-22 MED ORDER — METHYLPREDNISOLONE SODIUM SUCC 125 MG IJ SOLR
INTRAMUSCULAR | Status: AC
  Administered 2011-10-22: 11:00:00
  Filled 2011-10-22: qty 2

## 2011-10-22 MED ORDER — ONDANSETRON HCL 4 MG PO TABS: 4.0000 mg | ORAL_TABLET | Freq: Four times a day (QID) | ORAL | Status: DC | PRN

## 2011-10-22 MED ORDER — ACETAMINOPHEN 650 MG RE SUPP: 650.0000 mg | Freq: Four times a day (QID) | RECTAL | Status: DC | PRN

## 2011-10-22 MED ORDER — ALBUTEROL SULFATE (5 MG/ML) 0.5% IN NEBU
2.5000 mg | INHALATION_SOLUTION | RESPIRATORY_TRACT | Status: DC | PRN
  Administered 2011-10-22 – 2011-10-24 (×2): 2.5 mg via RESPIRATORY_TRACT
  Filled 2011-10-22 (×3): qty 0.5

## 2011-10-22 NOTE — ED Notes (Signed)
Unable to care report d/t receiving nurse unavailable

## 2011-10-22 NOTE — ED Notes (Signed)
MD at bedside. 

## 2011-10-22 NOTE — ED Notes (Signed)
Pt presents with SHOB worsening this am.  She was admitted last week for asthma exacerbation and not improving.  She has been using neb tx every 2 hours since Monday. Pt has received 2 neb tx and solumedrol via EMS

## 2011-10-22 NOTE — ED Provider Notes (Addendum)
History     CSN: [id number]  Arrival date & time 10/22/11  1033   First MD Initiated Contact with Patient 10/22/11 1040      Chief Complaint  Patient presents with  . Shortness of Breath  . Asthma    (Consider location/radiation/quality/duration/timing/severity/associated sxs/prior treatment) HPI The patient presents with shortness of breath. Notably, the patient was discharged from our hospitals 2 days ago following an admission one week ago for similar shortness of breath. She notes that since discharge she has been compliant with medications, including inhalers and steroids. Last night, approximately 12 hours ago her shortness of breath became more pronounced, not improving with inhalers. No clear exacerbating factors, though the patient notes no attempts at exertion or other significant activity. No fevers, chills, vomiting, confusion, disorientation. Notably, the patient received 3 albuterol nebulizer treatments in route via EMS, as well as Solu-Medrol 125mg .  Past Medical History  Diagnosis Date  . Asthma   . Grave's disease     Past Surgical History  Procedure Date  . Appendectomy   . Abdominal hysterectomy     No family history on file.  History  Substance Use Topics  . Smoking status: Never Smoker   . Smokeless tobacco: Not on file  . Alcohol Use: No    OB History    Grav Para Term Preterm Abortions TAB SAB Ect Mult Living                  Review of Systems  Constitutional:       HPI  HENT:       HPI otherwise negative  Eyes: Negative.   Respiratory:       HPI, otherwise negative  Cardiovascular:       HPI, otherwise nmegative  Gastrointestinal: Negative for vomiting.  Genitourinary:       HPI, otherwise negative  Musculoskeletal:       HPI, otherwise negative  Skin: Negative.   Neurological: Negative for syncope.    Allergies  Review of patient's allergies indicates no known allergies.  Home Medications   Current Outpatient Rx  Name  Route Sig Dispense Refill  . ALBUTEROL SULFATE HFA 108 (90 BASE) MCG/ACT IN AERS Inhalation Inhale 2 puffs into the lungs every 6 (six) hours as needed. For shortness of breath and wheezing     . ALBUTEROL SULFATE (5 MG/ML) 0.5% IN NEBU Nebulization Take 0.5 mLs (2.5 mg total) by nebulization every 4 (four) hours as needed for wheezing. 20 mL 0  . FLUTICASONE-SALMETEROL 250-50 MCG/DOSE IN AEPB Inhalation Inhale 1 puff into the lungs every 12 (twelve) hours.      Marland Kitchen LEVOTHYROXINE SODIUM 175 MCG PO TABS Oral Take 175 mcg by mouth daily.      Marland Kitchen PREDNISONE 50 MG PO TABS  Prednisone 50 mg po daily x 1 day, Prednisone 40 mg po daily x 1 day, Prednisone 30 mg po daily x 1 day, Prednisone 20 mg po daily x 1 day then Prednisone 10 mg po daily x 1 day then stop...   15 tablet 0    BP 135/74  Pulse 91  Resp 22  Ht 5\' 5"  (1.651 m)  Wt 180 lb (81.647 kg)  BMI 29.95 kg/m2  SpO2 100%  Physical Exam  Nursing note and vitals reviewed. Constitutional: She is oriented to person, place, and time. She appears well-developed and well-nourished. No distress.  HENT:  Head: Normocephalic and atraumatic.  Eyes: Conjunctivae are normal. Pupils are equal, round, and reactive to  light.  Cardiovascular: Normal rate and regular rhythm.   Pulmonary/Chest: Accessory muscle usage present. No stridor. No respiratory distress. She has wheezes in the right upper field, the right middle field, the right lower field, the left upper field, the left middle field and the left lower field.  Abdominal: She exhibits no distension.  Musculoskeletal: She exhibits no edema.  Neurological: She is alert and oriented to person, place, and time. No cranial nerve deficit.  Skin: Skin is warm. She is diaphoretic.  Psychiatric: She has a normal mood and affect.    ED Course  Procedures (including critical care time)  Labs Reviewed - No data to display No results found.   No diagnosis found.    Date: 10/22/2011  Rate: 87   Rhythm: normal sinus rhythm  QRS Axis: normal  Intervals: normal  ST/T Wave abnormalities: nonspecific ST/T changes  Conduction Disutrbances:none  Narrative Interpretation:   Old EKG Reviewed: unchanged  ABNORMAL ECG   MDM  This 46 year old female with asthma now presents with shortness of breath, several days after discharge. Notably the patient has been compliant with her steroids inhalers, and continues to have worsening dyspnea. Following 3 nebulizer treatments the patient continued to have expiratory wheezing. The patient will be admitted for frequency of treatments and further evaluation/management of her asthma exacerbation        Gerhard Munch, MD 10/22/11 1048  Gerhard Munch, MD 10/22/11 1124

## 2011-10-22 NOTE — ED Notes (Signed)
Pt reports improvement with continuous neb.

## 2011-10-22 NOTE — ED Notes (Signed)
Report given to paula Rn [facility] which states she will give receiving nurse report

## 2011-10-22 NOTE — ED Notes (Signed)
Secondary Assessment-  Pt has audible wheezing and tachypnea.  She reports using neb treatments q2h since Monday.

## 2011-10-22 NOTE — H&P (Signed)
PCP:   Jackie Plum, MD, MD   Chief Complaint:  Worsening shortness of breath  HPI: The patient is a 46 year old black female with past medical history significant for asthma, recently discharged from the hospital on 1/7 following admission with similar symptoms and was treated for asthma exacerbation. She states that she still had some shortness of breath are prior to discharge, and also had not been able to fill her prednisone following discharge (? It was sent to the wrong pharmacy)- as a result her shortness of breath worsened to the point where she had to come back to the ED. She admits to a nonproductive cough. She denies fevers. She also denies a chest pain, leg swelling, nausea/vomiting and no dysuria. She denies any URI symptoms at this time(to the cough as already mentioned). She was seen at the [place] [facility] and following multiple bronchodilator treatments and Solu-Medrol she was still short of breath arm and had of diffuse wheezing on lung exam and and so she was transferred for admission to the hospitalist service at [facility].  Review of Systems:  The patient denies anorexia, fever, weight loss,, vision loss, decreased hearing, hoarseness, chest pain, syncope, dyspnea on exertion, peripheral edema, balance deficits, hemoptysis, abdominal pain, melena, hematochezia, severe indigestion/heartburn, hematuria,  muscle weakness, transient blindness, difficulty walking, depression, unusual weight change, abnormal bleeding.  Past Medical History: Past Medical History  Diagnosis Date  . Asthma   . Grave's disease    Past Surgical History  Procedure Date  . Appendectomy   . Abdominal hysterectomy     Medications: Prior to Admission medications   Medication Sig Start Date End Date Taking? Authorizing Provider  albuterol (PROVENTIL HFA;VENTOLIN HFA) 108 (90 BASE) MCG/ACT inhaler Inhale 2 puffs into the lungs every 6 (six) hours as needed. For shortness of breath and  wheezing   Yes Historical Provider, MD  ergocalciferol (VITAMIN D2) 50000 UNITS capsule Take 50,000 Units by mouth 2 (two) times a week.   Yes Historical Provider, MD  Fluticasone-Salmeterol (ADVAIR) 250-50 MCG/DOSE AEPB Inhale 1 puff into the lungs 2 (two) times daily.    Yes Historical Provider, MD  levothyroxine (SYNTHROID, LEVOTHROID) 175 MCG tablet Take 175 mcg by mouth daily.    Yes Historical Provider, MD  predniSONE (DELTASONE) 10 MG tablet Take 10 mg by mouth daily. Take 50 mg (5 tabs) day 1, 40 mg (4 tabs) day 2, 30 mg (3 tabs) day 3, 20 mg (2 tabs) day 4 Then 10 mg (1 tab) day 5 the stop   Yes Historical Provider, MD    Allergies:  No Known Allergies  Social History:  reports that she has never smoked. She does not have any smokeless tobacco history on file. She reports that she does not drink alcohol or use illicit drugs.  Family History: Her grandmother had asthma.  Physical Exam: Filed Vitals:   10/22/11 1149 10/22/11 1218 10/22/11 1248 10/22/11 1438  BP:  132/73  136/82  Pulse: 90 104  100  Temp:    98.2 F (36.8 C)  TempSrc:    Oral  Resp: 26 26  24   Height:    5\' 5"  (1.651 m)  Weight:      SpO2: 97% 98% 100% 94%   Constitutional: Vital signs reviewed.  Patient is a well-developed and well-nourished respirations unlabored on nasal canula oxygen. Alert and oriented x3.  Head: Normocephalic and atraumatic Mouth: no erythema or exudates, MMM Eyes: PERRL, EOMI, conjunctivae normal, No scleral icterus.  Neck: Supple, Trachea midline normal ROM, No JVD, mass, thyromegaly, or carotid bruit present.  Cardiovascular: RRR, S1 normal, S2 normal, no MRG, pulses symmetric and intact bilaterally Pulmonary/Chest: Bilateral wheezes, no crackles. Abdominal: Soft. Non-tender, non-distended, bowel sounds are normal, no masses, organomegaly, or guarding present.  GU: no CVA tenderness  Extremities: No cyanosis, no edema  Neurological: A&O x3, Strenght is normal and symmetric  bilaterally, cranial nerve II-XII are grossly intact, no focal motor deficit, sensory intact to light touch bilaterally.  Skin: Warm, dry and intact. No rash, cyanosis, or clubbing.  Psychiatric: Normal mood and affect. speech and behavior is normal. Judgment and thought content normal. Cognition and memory are normal.      Labs on Admission:   [facility] 10/20/11 0540  NA 141  K 3.8  CL 105  CO2 25  GLUCOSE 91  BUN 11  CREATININE 0.73  CALCIUM 9.6  MG --  PHOS --    Basename 10/20/11 0540  AST 19  ALT 15  ALKPHOS 54  BILITOT 0.7  PROT 7.5  ALBUMIN 3.4*   No results found for this basename: LIPASE:2,AMYLASE:2 in the last 72 hours  Basename 10/20/11 0540  WBC 10.0  NEUTROABS --  HGB 12.4  HCT 37.3  MCV 83.1  PLT 307   No results found for this basename: CKTOTAL:3,CKMB:3,CKMBINDEX:3,TROPONINI:3 in the last 72 hours No results found for this basename: TSH,T4TOTAL,FREET3,T3FREE,THYROIDAB in the last 72 hours No results found for this basename: VITAMINB12:2,FOLATE:2,FERRITIN:2,TIBC:2,IRON:2,RETICCTPCT:2 in the last 72 hours  Radiological Exams on Admission: Dg Chest Portable 1 View  10/19/2011  *RADIOLOGY REPORT*  Clinical Data: Wheezing  PORTABLE CHEST - 1 VIEW  Comparison: 12/21/2008  Findings: Lungs are clear. No pleural effusion or pneumothorax.  Cardiomediastinal silhouette is within normal limits.  IMPRESSION: No evidence of acute cardiopulmonary disease.  Original Report Authenticated By: Charline Bills, M.D.    Assessment/Plan Present on Admission:  .Asthma exacerbation -As discussed above, likely secondary to noncompliance with prednisone(due to prescription not filled).we'll place on nebulized bronchodilators, and continue on advair, also IV steroids.Will obtain a chest x-ray follow and further manage accordingly. We'll hold off antibiotics are pending her studies for now since she has not had a  productive cough or fevers.  .Thyroid disease -Will continue  Synthroid.  Sharay Bellissimo C 10/22/2011, 5:11 PM

## 2011-10-22 NOTE — ED Notes (Signed)
continupus albuterol neb being administered by [employer], RRT

## 2011-10-23 LAB — BASIC METABOLIC PANEL
Calcium: 10 mg/dL (ref 8.4–10.5)
Creatinine, Ser: 0.68 mg/dL (ref 0.50–1.10)
GFR calc Af Amer: >90 mL/min (ref >90)
GFR calc non Af Amer: >90 mL/min (ref >90)

## 2011-10-23 MED ORDER — IPRATROPIUM BROMIDE 0.02 % IN SOLN
0.5000 mg | Freq: Two times a day (BID) | RESPIRATORY_TRACT | Status: DC
  Administered 2011-10-24 – 2011-10-25 (×3): 0.5 mg via RESPIRATORY_TRACT
  Filled 2011-10-23 (×3): qty 2.5

## 2011-10-23 MED ORDER — MOXIFLOXACIN HCL 400 MG PO TABS
400.0000 mg | ORAL_TABLET | Freq: Every day | ORAL | Status: DC
  Administered 2011-10-23 – 2011-10-24 (×2): 400 mg via ORAL
  Filled 2011-10-23 (×3): qty 1

## 2011-10-23 MED ORDER — ALBUTEROL SULFATE (5 MG/ML) 0.5% IN NEBU
2.5000 mg | INHALATION_SOLUTION | Freq: Two times a day (BID) | RESPIRATORY_TRACT | Status: DC
  Administered 2011-10-24 – 2011-10-25 (×3): 2.5 mg via RESPIRATORY_TRACT
  Filled 2011-10-23 (×3): qty 0.5

## 2011-10-23 NOTE — Progress Notes (Signed)
Subjective: States decreased SOB, still with cough Objective: Vital signs in last 24 hours: Temp:  [98.1 F (36.7 C)-98.5 F (36.9 C)] 98.5 F (36.9 C) (01/10 1300) Pulse Rate:  [78-121] 92  (01/10 1300) Resp:  [18-22] 18  (01/10 1300) BP: (110-121)/(67-77) 114/77 mmHg (01/10 1300) SpO2:  [91 %-95 %] 91 % (01/10 1611) Last BM Date: 10/22/11 Intake/Output from previous day:   Intake/Output this shift: Total I/O In: 444 [P.O.:444] Out: -     General Appearance:    Alert, cooperative, no distress, appears stated age  Lungs:     Decrease in bil. wheezes respirations unlabored   Heart:    Regular rate and rhythm, S1 and S2 normal, no murmur, rub   or gallop  Abdomen:     Soft, non-tender, bowel sounds active all four quadrants,    no masses, no organomegaly  Extremities:   Extremities normal, atraumatic, no cyanosis or edema  Neurologic:   CNII-XII intact, normal strength, sensation and reflexes    throughout    Weight change:   Intake/Output Summary (Last 24 hours) at 10/23/11 1854 Last data filed at 10/23/11 1300  Gross per 24 hour  Intake    444 ml  Output      0 ml  Net    444 ml    Lab Results:   Basename 10/23/11 1605 10/22/11 1651  NA 136 140  K 3.8 3.9  CL 103 105  CO2 22 20  GLUCOSE 109* 129*  BUN 13 9  CREATININE 0.68 0.61  CALCIUM 10.0 9.7    Basename 10/22/11 1651  WBC 9.6  HGB 13.1  HCT 38.7  PLT 332  MCV 82.0   PT/INR No results found for this basename: LABPROT:2,INR:2 in the last 72 hours ABG No results found for this basename: PHART:2,PCO2:2,PO2:2,HCO3:2 in the last 72 hours  Micro Results: No results found for this or any previous visit (from the past 240 hour(s)). Studies/Results: Portable Chest 1 View  10/22/2011  *RADIOLOGY REPORT*  Clinical Data: Shortness of breath.  PORTABLE CHEST - 1 VIEW  Comparison: 10/19/2011  Findings: The lungs are clear without focal infiltrate, edema, pneumothorax or pleural effusion. Cardiopericardial  silhouette is at upper limits of normal for size. Telemetry leads overlie the chest.  IMPRESSION: No acute cardiopulmonary findings.  Original Report Authenticated By: ERIC A. MANSELL, M.D.   Medications:  Scheduled Meds:   . albuterol  2.5 mg Nebulization BID  . dextromethorphan-guaiFENesin  1 tablet Oral BID  . enoxaparin  40 mg Subcutaneous Q24H  . ergocalciferol  50,000 Units Oral 2 times weekly  . Fluticasone-Salmeterol  1 puff Inhalation BID  . ipratropium  0.5 mg Nebulization Q6H  . levothyroxine  175 mcg Oral Daily  . methylPREDNISolone (SOLU-MEDROL) injection  60 mg Intravenous Q8H  . predniSONE  10 mg Oral Daily  . DISCONTD: albuterol  2.5 mg Nebulization Q6H   Continuous Infusions:  PRN Meds:.acetaminophen, acetaminophen, albuterol, bisacodyl, guaiFENesin-dextromethorphan, ondansetron (ZOFRAN) IV, ondansetron Assessment/Plan: Patient Active Hospital Problem List: Asthma exacerbation (10/22/2011) -continue nebs, steroids. Will add empiric abx -cxr neg for acut infiltrates   Thyroid disease (10/22/2011)   Continue sythroid.  LOS: 1 day   Tahlor Berenguer C 10/23/2011, 6:54 PM

## 2011-10-23 NOTE — Progress Notes (Signed)
Utilization review complete 

## 2011-10-24 LAB — URINALYSIS, ROUTINE W REFLEX MICROSCOPIC
Hgb urine dipstick: NEGATIVE
Leukocytes, UA: NEGATIVE
Protein, ur: NEGATIVE mg/dL
Urobilinogen, UA: 1 mg/dL (ref 0.0–1.0)

## 2011-10-24 MED ORDER — PREDNISONE 50 MG PO TABS
60.0000 mg | ORAL_TABLET | Freq: Every day | ORAL | Status: DC
  Administered 2011-10-24 – 2011-10-25 (×2): 60 mg via ORAL
  Filled 2011-10-24 (×2): qty 1

## 2011-10-24 NOTE — Progress Notes (Signed)
Utilization review complete 

## 2011-10-24 NOTE — Progress Notes (Signed)
Patient ID: Maria Everett, female   DOB: Oct 29, 1965, 46 y.o.   MRN: [id number]  The patient is a 46 year old black female with past medical history significant for asthma, recently discharged from the hospital on 1/7 following admission with similar symptoms and was treated for asthma exacerbation. She states that she still had some shortness of breath are prior to discharge, and also had not been able to fill her prednisone following discharge (? It was sent to the wrong pharmacy).   Subjective: I'm feeling better, but I need to be better before I go.  I don't want to come right back again. C/O 1. Itching sensation that started with IV solumedrol         2.  Intermittent pain in left lower flank.  Patient denies any bending, lifting or trauma.  Denies any urinary problems.  Objective: Weight change:   Intake/Output Summary (Last 24 hours) at 10/24/11 1015 Last data filed at 10/23/11 1856  Gross per 24 hour  Intake    684 ml  Output      0 ml  Net    684 ml   Blood pressure 116/75, pulse 62, temperature 97.9 F (36.6 C), temperature source Oral, resp. rate 18, height 5\' 5"  (1.651 m), weight 81.647 kg (180 lb), SpO2 94.00%. Filed Vitals:   10/23/11 2120 10/23/11 2145 10/24/11 0540 10/24/11 0823  BP:  118/78 116/75   Pulse:  67 62   Temp:  97.7 F (36.5 C) 97.9 F (36.6 C)   TempSrc:      Resp:  18 18   Height:      Weight:      SpO2: 96% 94% 94% 94%    Physical Exam: General: No acute distress,  Well appearing. Lungs: Expiratory wheeze bilaterally (made worse by forced sounds).  No crackles. Bronchial sounding cough. Cardiovascular: Regular rate and rhythm without murmur gallop or rub normal S1 and S2 Abdomen: Nontender, nondistended, soft, bowel sounds positive, no rebound, no ascites, no appreciable mass Extremities: No significant cyanosis, clubbing, or edema bilateral lower extremities  Basic Metabolic Panel:  Lab 10/23/11 0865 10/22/11 1651  NA 136 140  K 3.8 3.9    CL 103 105  CO2 22 20  GLUCOSE 109* 129*  BUN 13 9  CREATININE 0.68 0.61  CALCIUM 10.0 9.7  MG -- --  PHOS -- --   Liver Function Tests:  Lab 10/22/11 1651 10/20/11 0540  AST 20 19  ALT 17 15  ALKPHOS 61 54  BILITOT 0.5 0.7  PROT 8.1 7.5  ALBUMIN 3.7 3.4*   CBC:  Lab 10/22/11 1651 10/20/11 0540 10/19/11 1010  WBC 9.6 10.0 --  NEUTROABS -- -- 4.0  HGB 13.1 12.4 --  HCT 38.7 37.3 --  MCV 82.0 83.1 --  PLT 332 307 --     Studies/Results: 1/9 Chest Xray is negative.   Scheduled Meds:   . albuterol  2.5 mg Nebulization BID  . dextromethorphan-guaiFENesin  1 tablet Oral BID  . enoxaparin  40 mg Subcutaneous Q24H  . ergocalciferol  50,000 Units Oral 2 times weekly  . Fluticasone-Salmeterol  1 puff Inhalation BID  . ipratropium  0.5 mg Nebulization BID  . levothyroxine  175 mcg Oral Daily  . moxifloxacin  400 mg Oral q1800  . predniSONE  60 mg Oral Daily  . DISCONTD: albuterol  2.5 mg Nebulization Q6H  . DISCONTD: ipratropium  0.5 mg Nebulization Q6H  . DISCONTD: methylPREDNISolone (SOLU-MEDROL) injection  60 mg Intravenous  Q8H  . DISCONTD: predniSONE  10 mg Oral Daily   Continuous Infusions:  PRN Meds:.acetaminophen, acetaminophen, albuterol, bisacodyl, guaiFENesin-dextromethorphan, ondansetron (ZOFRAN) IV, ondansetron  Assessment/Plan: Active Problems:  Asthma exacerbation  Thyroid disease  1.  Asthma Exacerbation secondary to non-compliance -  Patient much improved by her report.  Can be discharged today or tomorrow (patient strongly prefers not to go today as she just recently had to be re-admitted).   Will continue current treatment but adjust steroid dose down and to PO.  Patient is on empiric avelox.  When she is discharged she will need paper prescriptions handed to her (rather than e-prescribed)  2.  Thyroid disease - stable on synthroid.   LOS: 2 days   Stephani Police 10/24/2011, 10:15 AM [phone]

## 2011-10-24 NOTE — Progress Notes (Signed)
I  Have seen and examined patient, she feels better this am - agree with Marianne's eval and plan. Will f/u on UA  And monitor on oral prednisone today- will plan d/c in am if continues to do well.

## 2011-10-25 MED ORDER — ALBUTEROL SULFATE (5 MG/ML) 0.5% IN NEBU: 2.5000 mg | INHALATION_SOLUTION | RESPIRATORY_TRACT | Status: DC | PRN

## 2011-10-25 MED ORDER — MOXIFLOXACIN HCL 400 MG PO TABS: 400.0000 mg | ORAL_TABLET | Freq: Every day | ORAL | Status: AC

## 2011-10-25 MED ORDER — DM-GUAIFENESIN ER 30-600 MG PO TB12: 1.0000 | ORAL_TABLET | Freq: Two times a day (BID) | ORAL | Status: AC

## 2011-10-25 MED ORDER — FLUTICASONE-SALMETEROL 250-50 MCG/DOSE IN AEPB: 1.0000 | INHALATION_SPRAY | Freq: Two times a day (BID) | RESPIRATORY_TRACT | Status: DC

## 2011-10-25 MED ORDER — ALBUTEROL SULFATE HFA 108 (90 BASE) MCG/ACT IN AERS: 2.0000 | INHALATION_SPRAY | Freq: Four times a day (QID) | RESPIRATORY_TRACT | Status: DC | PRN

## 2011-10-25 NOTE — Progress Notes (Signed)
Wynonia Lawman  to be discharged Home per MD order.  Discharge instructions reviewed and discussed with the patient, all questions and concerns answered. Copy of instructions and scripts given to patient.   [place]ole, [place]  Home Medication Instructions ZOX:[id number]   Printed on:10/25/11 1316  Medication Information                    levothyroxine (SYNTHROID, LEVOTHROID) 175 MCG tablet Take 175 mcg by mouth daily.            ergocalciferol (VITAMIN D2) 50000 UNITS capsule Take 50,000 Units by mouth 2 (two) times a week.           predniSONE (DELTASONE) 10 MG tablet Take 10 mg by mouth daily. Take 50 mg (5 tabs) day 1, 40 mg (4 tabs) day 2, 30 mg (3 tabs) day 3, 20 mg (2 tabs) day 4 Then 10 mg (1 tab) day 5 the stop           albuterol (PROVENTIL) (5 MG/ML) 0.5% nebulizer solution Take 0.5 mLs (2.5 mg total) by nebulization every 4 (four) hours as needed for wheezing.           dextromethorphan-guaiFENesin (MUCINEX DM) 30-600 MG per 12 hr tablet Take 1 tablet by mouth 2 (two) times daily.           moxifloxacin (AVELOX) 400 MG tablet Take 1 tablet (400 mg total) by mouth daily at 6 PM.           Fluticasone-Salmeterol (ADVAIR) 250-50 MCG/DOSE AEPB Inhale 1 puff into the lungs 2 (two) times daily.           albuterol (PROVENTIL HFA;VENTOLIN HFA) 108 (90 BASE) MCG/ACT inhaler Inhale 2 puffs into the lungs every 6 (six) hours as needed. For shortness of breath and wheezing             Patients skin is clean, dry and intact, no evidence of skin break down. IV site discontinued and catheter remains intact. Site without signs and symptoms of complications. Dressing and pressure applied.  Patient awaiting transport home will continue to monitor until discharged.  Julien Nordmann [facility] 10/25/2011 1:16 PM

## 2011-10-25 NOTE — Progress Notes (Signed)
Pt transported to car by NT in wheelchair, no distress noted on discharge. Julien Nordmann [facility]

## 2011-10-25 NOTE — Discharge Summary (Signed)
Name: Maria Everett MRN: [id number] DOB: Aug 08, 1966 46 y.o.  Date of Admission: 10/22/2011 10:33 AM Date of Discharge: 10/25/2011 Attending Physician: Kela Millin  Discharge Diagnosis: Active Problems:  Asthma exacerbation  Thyroid disease   Discharge Medications: Current Discharge Medication List    START taking these medications   Details  dextromethorphan-guaiFENesin (MUCINEX DM) 30-600 MG per 12 hr tablet Take 1 tablet by mouth 2 (two) times daily. Qty: 30 tablet, Refills: 0    moxifloxacin (AVELOX) 400 MG tablet Take 1 tablet (400 mg total) by mouth daily at 6 PM. Qty: 5 tablet, Refills: 0      CONTINUE these medications which have CHANGED   Details  albuterol (PROVENTIL HFA;VENTOLIN HFA) 108 (90 BASE) MCG/ACT inhaler Inhale 2 puffs into the lungs every 6 (six) hours as needed. For shortness of breath and wheezing Qty: 8.5 g, Refills: 0    albuterol (PROVENTIL) (5 MG/ML) 0.5% nebulizer solution Take 0.5 mLs (2.5 mg total) by nebulization every 4 (four) hours as needed for wheezing. Qty: 20 mL, Refills: 0    Fluticasone-Salmeterol (ADVAIR) 250-50 MCG/DOSE AEPB Inhale 1 puff into the lungs 2 (two) times daily. Qty: 60 each, Refills: 0      CONTINUE these medications which have NOT CHANGED   Details  ergocalciferol (VITAMIN D2) 50000 UNITS capsule Take 50,000 Units by mouth 2 (two) times a week.    levothyroxine (SYNTHROID, LEVOTHROID) 175 MCG tablet Take 175 mcg by mouth daily.     predniSONE (DELTASONE) 10 MG tablet Take 10 mg by mouth daily. Take 50 mg (5 tabs) day 1, 40 mg (4 tabs) day 2, 30 mg (3 tabs) day 3, 20 mg (2 tabs) day 4 Then 10 mg (1 tab) day 5 the stop        Disposition and follow-up:   Ms.Scheryl Lowell Guitar was discharged from [facility] in improved/ condition.    Follow-up Appointments: Discharge Orders    Future Orders Please Complete By Expires   Diet general      Increase activity slowly         Consultations:      Procedures Performed:  Portable Chest 1 View  10/22/2011  *RADIOLOGY REPORT*  Clinical Data: Shortness of breath.  PORTABLE CHEST - 1 VIEW  Comparison: 10/19/2011  Findings: The lungs are clear without focal infiltrate, edema, pneumothorax or pleural effusion. Cardiopericardial silhouette is at upper limits of normal for size. Telemetry leads overlie the chest.  IMPRESSION: No acute cardiopulmonary findings.  Original Report Authenticated By: ERIC A. MANSELL, M.D.   Dg Chest Portable 1 View  10/19/2011  *RADIOLOGY REPORT*  Clinical Data: Wheezing  PORTABLE CHEST - 1 VIEW  Comparison: 12/21/2008  Findings: Lungs are clear. No pleural effusion or pneumothorax.  Cardiomediastinal silhouette is within normal limits.  IMPRESSION: No evidence of acute cardiopulmonary disease.  Original Report Authenticated By: Charline Bills, M.D.   Brief history The patient is a 46 year old black female with past medical history significant for asthma, recently discharged from the hospital on 1/7 following admission with similar symptoms and was treated for asthma exacerbation. She states that she still had some shortness of breath are prior to discharge, and also had not been able to fill her prednisone following discharge (? It was sent to the wrong pharmacy)- as a result her shortness of breath worsened to the point where she had to come back to the ED. She admits to a nonproductive cough. She denies fevers. She also denies  a chest pain, leg swelling, nausea/vomiting and no dysuria. She denies any URI symptoms at this time(to the cough as already mentioned). She was seen at [place] [facility] and following multiple bronchodilator treatments and Solu-Medrol she was still short of breath arm and had of diffuse wheezing on lung exam and and so she was transferred for admission to the hospitalist service at [facility].    physical exam General Appearance:    Alert, cooperative, no distress, appears stated age   Lungs:     Clear to auscultation bilaterally except for occasional wheeze, respirations unlabored   Heart:    Regular rate and rhythm, S1 and S2 normal, no murmur, rub   or gallop  Abdomen:     Soft, non-tender, bowel sounds active all four quadrants,    no masses, no organomegaly  Extremities:   Extremities normal, atraumatic, no cyanosis or edema  Neurologic:   CNII-XII intact, normal strength, sensation and reflexes    throughout      Hospital Course by problem list: Active Problems:  Asthma exacerbation -Upon admission patient was started on nebulized bronchodilators IV steroids and mucolytics. He chest x-ray was done and came back with no acute infiltrates. On followup visit  oral antibiotics were added to her treatment regimen. She responded well to this interventions and her IV steroids were changed to by mouth.Her Shortness of breath is resolved ,she still has a cough but it is  improved. She is oxygenating well on room air and will be discharged on oral antibiotics and a prednisone taper, as well as bronchodilators. She has been handed  paper prescriptions which she is to filll upon discharge . She is to followup with her primary care physician.  Thyroid disease -She is to continue her Synthroid upon discharge.  Discharge Vitals:  BP 106/65  Pulse 70  Temp(Src) 98.3 F (36.8 C) (Oral)  Resp 18  Ht 5\' 5"  (1.651 m)  Wt 81.647 kg (180 lb)  BMI 29.95 kg/m2  SpO2 99%  Discharge Labs: No results found for this or any previous visit (from the past 24 hour(s)).  SignedKela Millin 10/25/2011, 11:58 AM

## 2011-10-27 NOTE — Discharge Summary (Signed)
[facility] Physician Signed Internal Medicine Progress Notes 10/20/2011 3:24 PM  Maria Everett, 46 y.o., DOB Jul 10, 1966, MRN [id number]. Admission date: 10/19/2011 Discharge Date 10/20/2011 Primary MD Jackie Plum, MD, MD Admitting Physician Meredeth Ide   Admission Diagnosis  Asthma attack [493.90] sob..hx of asthma   Discharge Diagnosis   Active Problems:  * No active hospital problems. *      Past Medical History   Diagnosis  Date   .  Asthma     .  Grave's disease         Past Surgical History   Procedure  Date   .  Appendectomy     .  Abdominal hysterectomy        Brief History and physical 65 female with a history of asthma who came to [facility] with the chief complaint of shortness of breath for 2to 3 days. Patient says that usually she takes albuterol rescue inhaler 2-3 times a week and also takes Advair puff twice a day but she was not getting better so she came to the hospital. Patient also complains of runny nose going on for past 10 days since Christmas. She denies any chest pain no nausea vomiting or diarrhea.     Hospital Course    Asthma: Patient was admitted with asthma, started on albuterol nebulizer, prednisone therapy. Asthma is now resolved. Will send on Nebulizer.   Hypothyroidism Continue Synthroid.           Significant Tests:  See full reports for all details      Dg Chest Portable 1 View   10/19/2011  *RADIOLOGY REPORT*  Clinical Data: Wheezing  PORTABLE CHEST - 1 VIEW  Comparison: 12/21/2008  Findings: Lungs are clear. No pleural effusion or pneumothorax.  Cardiomediastinal silhouette is within normal limits.  IMPRESSION: No evidence of acute cardiopulmonary disease.  Original Report Authenticated By: Charline Bills, M.D.        Today     Subjective:      Alizzon Dioguardi today has no headache,no chest abdominal pain, no shortness of breath. Objective:      Blood pressure 107/68, pulse 85,  temperature 98.1 F (36.7 C), temperature source Oral, resp. rate 19, height 5\' 5"  (1.651 m), weight 84.823 kg (187 lb), SpO2 93.00%. No intake or output data in the 24 hours ending 10/20/11 1524   Exam Awake Alert, Oriented *3, No new F.N deficits, Normal affect [place].AT,PERRAL Supple Neck,No JVD, No cervical lymphadenopathy appriciated.   Symmetrical Chest wall movement, scattered wheezing. RRR,No Gallops,Rubs or new Murmurs, No Parasternal Heave +ve B.Sounds, Abd Soft, Non tender, No organomegaly appriciated, No rebound -guarding or rigidity. No Cyanosis, Clubbing or edema, No new Rash or bruise   Data Review        CBC w Diff:  Lab Results   Component  Value  Date     WBC  10.0  10/20/2011     HGB  12.4  10/20/2011     HCT  37.3  10/20/2011     PLT  307  10/20/2011     LYMPHOPCT  14  10/19/2011     MONOPCT  2*  10/19/2011     EOSPCT  8*  10/19/2011     BASOPCT  1  10/19/2011    CMP:  Lab Results   Component  Value  Date     NA  141  10/20/2011     K  3.8  10/20/2011     CL  105  10/20/2011     CO2  25  10/20/2011     BUN  11  10/20/2011     CREATININE  0.73  10/20/2011     PROT  7.5  10/20/2011     ALBUMIN  3.4*  10/20/2011     BILITOT  0.7  10/20/2011     ALKPHOS  54  10/20/2011     AST  19  10/20/2011     ALT  15  10/20/2011    .       Discharge Instructions         Follow-up Information      Follow up with OSEI-BONSU,GEORGE, MD .              Discharge Medications     Medication List   As of 10/20/2011  3:24 PM     START taking these medications              * albuterol (5 MG/ML) 0.5% nebulizer solution     Commonly known as: PROVENTIL     Take 0.5 mLs (2.5 mg total) by nebulization every 4 (four) hours as needed for wheezing.           predniSONE 50 MG tablet     Commonly known as: DELTASONE     Prednisone 50 mg po daily x 1 day, Prednisone 40 mg po daily x 1 day, Prednisone 30 mg po daily x 1 day, Prednisone 20 mg po daily x 1 day then Prednisone 10 mg po daily x 1 day  then stop...                 * Notice: This list has 1 medication(s) that are the same as other medications prescribed for you. Read the directions carefully, and ask your doctor or other care provider to review them with you.            CONTINUE taking these medications              * albuterol 108 (90 BASE) MCG/ACT inhaler     Commonly known as: PROVENTIL HFA;VENTOLIN HFA           Fluticasone-Salmeterol 250-50 MCG/DOSE Aepb     Commonly known as: ADVAIR           levothyroxine 175 MCG tablet     Commonly known as: SYNTHROID, LEVOTHROID         * Notice: This list has 1 medication(s) that are the same as other medications prescribed for you. Read the directions carefully, and ask your doctor or other care provider to review them with you.            STOP taking these medications              chlorpheniramine-HYDROcodone 10-8 MG/5ML Lqcr                 Where to get your medications      These are the prescriptions that you need to pick up. We sent them to a specific pharmacy, so you will need to go there to get them.     [id number] - [place], [place] - [address], [address] AT [facility]      [address], [address] [place] [address]    Phone: [phone]             albuterol (5 MG/ML) 0.5% nebulizer solution  You may get these medications from any pharmacy.              predniSONE 50 MG tablet                      Total Time in preparing paper work, data evaluation and todays exam - 35 minutes   Trenise Turay S M.D on 10/20/2011 at 3:24 PM   [facility] Office  [phone]

## 2015-06-13 ENCOUNTER — Encounter (HOSPITAL_BASED_OUTPATIENT_CLINIC_OR_DEPARTMENT_OTHER): Payer: Self-pay | Admitting: Emergency Medicine

## 2015-06-13 ENCOUNTER — Emergency Department (HOSPITAL_BASED_OUTPATIENT_CLINIC_OR_DEPARTMENT_OTHER): Payer: BLUE CROSS/BLUE SHIELD

## 2015-06-13 ENCOUNTER — Emergency Department (HOSPITAL_BASED_OUTPATIENT_CLINIC_OR_DEPARTMENT_OTHER)
Admission: EM | Admit: 2015-06-13 | Discharge: 2015-06-14 | Disposition: A | Discharge status: Home / Self Care | Payer: BLUE CROSS/BLUE SHIELD | Attending: Emergency Medicine | Admitting: Emergency Medicine

## 2015-06-13 DIAGNOSIS — E039 Hypothyroidism, unspecified: Secondary | ICD-10-CM | POA: Insufficient documentation

## 2015-06-13 DIAGNOSIS — J45901 Unspecified asthma with (acute) exacerbation: Secondary | ICD-10-CM

## 2015-06-13 DIAGNOSIS — Z7951 Long term (current) use of inhaled steroids: Secondary | ICD-10-CM | POA: Insufficient documentation

## 2015-06-13 DIAGNOSIS — R0602 Shortness of breath: Secondary | ICD-10-CM | POA: Diagnosis present

## 2015-06-13 DIAGNOSIS — Z79899 Other long term (current) drug therapy: Secondary | ICD-10-CM | POA: Insufficient documentation

## 2015-06-13 MED ORDER — ALBUTEROL SULFATE (2.5 MG/3ML) 0.083% IN NEBU
2.5000 mg | INHALATION_SOLUTION | Freq: Once | RESPIRATORY_TRACT | Status: AC
  Administered 2015-06-13: 2.5 mg via RESPIRATORY_TRACT
  Filled 2015-06-13: qty 3

## 2015-06-13 MED ORDER — IPRATROPIUM-ALBUTEROL 0.5-2.5 (3) MG/3ML IN SOLN
3.0000 mL | Freq: Once | RESPIRATORY_TRACT | Status: AC
  Administered 2015-06-13: 3 mL via RESPIRATORY_TRACT
  Filled 2015-06-13: qty 3

## 2015-06-13 NOTE — ED Notes (Signed)
Productive cough and SOB x2 days.

## 2015-06-13 NOTE — ED Notes (Signed)
MD at bedside. 

## 2015-06-13 NOTE — ED Provider Notes (Signed)
CSN: [id number]     Arrival date & time 06/13/15  2316 History   This chart was scribed for Maria Baton, MD by Arlan Organ, ED Scribe. This patient was seen in room MH05/MH05 and the patient's care was started 11:54 PM.   Chief Complaint  Patient presents with  . Shortness of Breath   The history is provided by the patient. No language interpreter was used.    HPI Comments: Maria Everett is a 49 y.o. female with a PMHx of asthma and grave's disease who presents to the Emergency Department complaining of constant, ongoing shortness of breath x 2 days. She also reports a productive cough, HA, and congestion. Denies any aggravating or alleviating factors at this time. Allegra and inhaler attempted at home without any improvement for symptoms. No other OTC medications or home remedies attempted prior to arrival. No recent fever, chills, chest pain, or abdominal pain. Admits to previous hospitalization for asthma exacerbation. No known allergies to medications.  Past Medical History  Diagnosis Date  . Asthma   . Grave's disease   . Bronchitis   . Shortness of breath 10/22/11    "all the time right now"  . Hypothyroidism    Past Surgical History  Procedure Laterality Date  . Appendectomy  11/2007  . Abdominal hysterectomy  11/2007  . Cesarean section  4098; 1987; 1993; 1997   No family history on file. Social History  Substance Use Topics  . Smoking status: Never Smoker   . Smokeless tobacco: Never Used  . Alcohol Use: No   OB History    No data available     Review of Systems  Constitutional: Negative for fever and chills.  HENT: Negative for congestion.   Respiratory: Positive for cough and shortness of breath.   Cardiovascular: Negative for chest pain.  Gastrointestinal: Negative for nausea and abdominal pain.  Neurological: Negative for dizziness, weakness and headaches.  Psychiatric/Behavioral: Negative for confusion.  All other systems reviewed and are  negative.     Allergies  Review of patient's allergies indicates no known allergies.  Home Medications   Prior to Admission medications   Medication Sig Start Date End Date Taking? Authorizing Provider  fexofenadine (ALLEGRA) 30 MG tablet Take 30 mg by mouth daily.   Yes Historical Provider, MD  albuterol (PROVENTIL HFA;VENTOLIN HFA) 108 (90 BASE) MCG/ACT inhaler Inhale 2 puffs into the lungs every 6 (six) hours as needed. For shortness of breath and wheezing 10/25/11   Kela Millin, MD  albuterol (PROVENTIL HFA;VENTOLIN HFA) 108 (90 BASE) MCG/ACT inhaler Inhale 2 puffs into the lungs every 4 (four) hours as needed for wheezing or shortness of breath. 06/14/15   Maria Baton, MD  albuterol (PROVENTIL) (5 MG/ML) 0.5% nebulizer solution Take 0.5 mLs (2.5 mg total) by nebulization every 4 (four) hours as needed for wheezing. 10/25/11 10/24/12  Kela Millin, MD  benzonatate (TESSALON) 100 MG capsule Take 1 capsule (100 mg total) by mouth 3 (three) times daily as needed for cough. 06/14/15   Maria Baton, MD  ergocalciferol (VITAMIN D2) 50000 UNITS capsule Take 50,000 Units by mouth 2 (two) times a week.    Historical Provider, MD  Fluticasone-Salmeterol (ADVAIR) 250-50 MCG/DOSE AEPB Inhale 1 puff into the lungs 2 (two) times daily. 10/25/11   Kela Millin, MD  levothyroxine (SYNTHROID, LEVOTHROID) 175 MCG tablet Take 175 mcg by mouth daily.     Historical Provider, MD  predniSONE (DELTASONE) 20 MG tablet Take 3 tablets (  60 mg total) by mouth daily with breakfast. 06/14/15   Maria Baton, MD   Triage Vitals: BP 120/70 mmHg  Pulse 72  Temp(Src) 98.4 F (36.9 C) (Oral)  Resp 20  Ht 5\' 6"  (1.676 m)  Wt 190 lb (86.183 kg)  BMI 30.68 kg/m2  SpO2 96%   Physical Exam  Constitutional: She is oriented to person, place, and time. She appears well-developed and well-nourished. No distress.  Overweight  HENT:  Head: Normocephalic and atraumatic.  Mouth/Throat: Oropharynx is clear  and moist.  Eyes: Pupils are equal, round, and reactive to light.  Cardiovascular: Normal rate, regular rhythm and normal heart sounds.   No murmur heard. Pulmonary/Chest: Effort normal. No respiratory distress. She has wheezes.  Fair air movement, diffuse expiratory wheezing  Abdominal: Soft. Bowel sounds are normal. There is no tenderness. There is no rebound.  Neurological: She is alert and oriented to person, place, and time.  Skin: Skin is warm and dry.  Psychiatric: She has a normal mood and affect.  Nursing note and vitals reviewed.   ED Course  Procedures (including critical care time)  DIAGNOSTIC STUDIES: Oxygen Saturation is 98% on RA, Normal by my interpretation.    COORDINATION OF CARE: 12:00 AM- Will give breathing treatment, Tessalon, and Deltasone.Discussed treatment plan with pt at bedside and pt agreed to plan.     Labs Review Labs Reviewed - No data to display  Imaging Review Dg Chest 2 View  06/14/2015   CLINICAL DATA:  Dyspnea and cough.  EXAM: CHEST  2 VIEW  COMPARISON:  05/27/2015  FINDINGS: The heart size and mediastinal contours are within normal limits. Both lungs are clear. The visualized skeletal structures are unremarkable.  IMPRESSION: No active cardiopulmonary disease.   Electronically Signed   By: Ellery Plunk M.D.   On: 06/14/2015 01:28   I have personally reviewed and evaluated these images and lab results as part of my medical decision-making.   EKG Interpretation None     Medications  albuterol (PROVENTIL,VENTOLIN) solution continuous neb (0 mg/hr Nebulization Stopped 06/14/15 0136)  ipratropium-albuterol (DUONEB) 0.5-2.5 (3) MG/3ML nebulizer solution 3 mL (3 mLs Nebulization Given 06/13/15 2334)  albuterol (PROVENTIL) (2.5 MG/3ML) 0.083% nebulizer solution 2.5 mg (2.5 mg Nebulization Given 06/13/15 2334)  albuterol (PROVENTIL) (2.5 MG/3ML) 0.083% nebulizer solution 5 mg (5 mg Nebulization Given 06/14/15 0004)  predniSONE (DELTASONE) tablet 60  mg (60 mg Oral Given 06/14/15 0011)  benzonatate (TESSALON) capsule 100 mg (100 mg Oral Given 06/14/15 0011)    MDM   Final diagnoses:  Asthma exacerbation   Patient presents with wheezing, cough, and shortness of breath. Nontoxic on exam. Satting 96%. Diffuse wheezing. History of asthma. Patient was given a DuoNeb 2 and then placed on continuous nebulization. She is in no respiratory distress but continues to wheeze. Chest x-ray shows no evidence of pneumonia. Patient was given prednisone. On recheck, wheezing improved. Patient states that she feels much better. She ambulated and maintain pulse ox greater than 98%. No respiratory distress noted. Discussed with patient continued prednisone at home. She was encouraged to use her albuterol inhaler every 2 hours for the next 6 hours at home. She will also be [facility] with [facility]. Follow-up with her primary physician.  After history, exam, and medical workup I feel the patient has been appropriately medically screened and is safe for discharge home. Pertinent diagnoses were discussed with the patient. Patient was given return precautions.  I personally performed the services described in this documentation,  which was scribed in my presence. The recorded information has been reviewed and is accurate.    Maria Baton, MD 06/14/15 [phone]

## 2015-06-14 MED ORDER — BENZONATATE 100 MG PO CAPS: 100.0000 mg | ORAL_CAPSULE | Freq: Three times a day (TID) | ORAL | Status: DC | PRN

## 2015-06-14 MED ORDER — BENZONATATE 100 MG PO CAPS
100.0000 mg | ORAL_CAPSULE | Freq: Once | ORAL | Status: AC
  Administered 2015-06-14: 100 mg via ORAL
  Filled 2015-06-14: qty 1

## 2015-06-14 MED ORDER — ALBUTEROL SULFATE (2.5 MG/3ML) 0.083% IN NEBU
5.0000 mg | INHALATION_SOLUTION | Freq: Once | RESPIRATORY_TRACT | Status: AC
  Administered 2015-06-14: 5 mg via RESPIRATORY_TRACT
  Filled 2015-06-14: qty 6

## 2015-06-14 MED ORDER — ALBUTEROL SULFATE HFA 108 (90 BASE) MCG/ACT IN AERS: 2.0000 | INHALATION_SPRAY | RESPIRATORY_TRACT | Status: DC | PRN

## 2015-06-14 MED ORDER — PREDNISONE 50 MG PO TABS
60.0000 mg | ORAL_TABLET | Freq: Once | ORAL | Status: AC
  Administered 2015-06-14: 60 mg via ORAL
  Filled 2015-06-14 (×2): qty 1

## 2015-06-14 MED ORDER — ALBUTEROL SULFATE (2.5 MG/3ML) 0.083% IN NEBU: 5.0000 mg | INHALATION_SOLUTION | Freq: Once | RESPIRATORY_TRACT | Status: DC

## 2015-06-14 MED ORDER — PREDNISONE 20 MG PO TABS: 60.0000 mg | ORAL_TABLET | Freq: Every day | ORAL | Status: DC

## 2015-06-14 MED ORDER — ALBUTEROL (5 MG/ML) CONTINUOUS INHALATION SOLN
10.0000 mg/h | INHALATION_SOLUTION | RESPIRATORY_TRACT | Status: AC
  Administered 2015-06-14: 10 mg/h via RESPIRATORY_TRACT
  Filled 2015-06-14: qty 20

## 2015-06-14 NOTE — ED Notes (Signed)
MD at bedside.  RT at bedside.

## 2015-06-14 NOTE — Discharge Instructions (Signed)

## 2015-06-14 NOTE — ED Notes (Signed)
Per physician ambulated patient walking around ED unit. Patient had steady gait, HR 86/ RR 16/ SATS 99%. Patient in no distress, WNL. Physician notified.

## 2015-12-07 ENCOUNTER — Emergency Department (HOSPITAL_BASED_OUTPATIENT_CLINIC_OR_DEPARTMENT_OTHER)
Admission: EM | Admit: 2015-12-07 | Discharge: 2015-12-07 | Disposition: A | Discharge status: Home / Self Care | Payer: BLUE CROSS/BLUE SHIELD | Attending: Emergency Medicine | Admitting: Emergency Medicine

## 2015-12-07 ENCOUNTER — Encounter (HOSPITAL_BASED_OUTPATIENT_CLINIC_OR_DEPARTMENT_OTHER): Payer: Self-pay | Admitting: *Deleted

## 2015-12-07 DIAGNOSIS — Z7951 Long term (current) use of inhaled steroids: Secondary | ICD-10-CM | POA: Insufficient documentation

## 2015-12-07 DIAGNOSIS — Z79899 Other long term (current) drug therapy: Secondary | ICD-10-CM | POA: Insufficient documentation

## 2015-12-07 DIAGNOSIS — M545 Low back pain, unspecified: Secondary | ICD-10-CM

## 2015-12-07 DIAGNOSIS — J45909 Unspecified asthma, uncomplicated: Secondary | ICD-10-CM | POA: Insufficient documentation

## 2015-12-07 DIAGNOSIS — M79606 Pain in leg, unspecified: Secondary | ICD-10-CM | POA: Insufficient documentation

## 2015-12-07 DIAGNOSIS — E039 Hypothyroidism, unspecified: Secondary | ICD-10-CM | POA: Diagnosis not present

## 2015-12-07 MED ORDER — CYCLOBENZAPRINE HCL 10 MG PO TABS: 10.0000 mg | ORAL_TABLET | Freq: Two times a day (BID) | ORAL | Status: DC | PRN

## 2015-12-07 MED ORDER — MELOXICAM 7.5 MG PO TABS: 7.5000 mg | ORAL_TABLET | Freq: Every day | ORAL | Status: AC

## 2015-12-07 NOTE — ED Provider Notes (Signed)
CSN: [id number]     Arrival date & time 12/07/15  1614 History   First MD Initiated Contact with Patient 12/07/15 1635     Chief Complaint  Patient presents with  . Back Pain     (Consider location/radiation/quality/duration/timing/severity/associated sxs/prior Treatment) Patient is a 50 y.o. female presenting with back pain.  Back Pain Pain location: right lower. Quality:  Aching and cramping Radiates to:  Does not radiate Pain severity:  Mild Pain is:  Worse during the day Onset quality:  Gradual Timing:  Constant Progression:  Waxing and waning Chronicity:  New Context: not falling   Relieved by: stretching. Worsened by:  Ambulation (sleeping) Associated symptoms: leg pain (radiating from right lower back)   Associated symptoms: no abdominal pain, no abdominal swelling, no chest pain, no dysuria, no fever, no numbness, no paresthesias, no perianal numbness, no tingling, no weakness and no weight loss     Past Medical History  Diagnosis Date  . Asthma   . Grave's disease   . Bronchitis   . Shortness of breath 10/22/11    "all the time right now"  . Hypothyroidism    Past Surgical History  Procedure Laterality Date  . Appendectomy  11/2007  . Abdominal hysterectomy  11/2007  . Cesarean section  4098; 1987; 1993; 1997   No family history on file. Social History  Substance Use Topics  . Smoking status: Never Smoker   . Smokeless tobacco: Never Used  . Alcohol Use: No   OB History    No data available     Review of Systems  Constitutional: Negative for fever, chills and weight loss.  Respiratory: Negative for cough and shortness of breath.   Cardiovascular: Negative for chest pain.  Gastrointestinal: Negative for abdominal pain.  Genitourinary: Negative for dysuria.  Musculoskeletal: Positive for back pain.  Skin: Negative for pallor and wound.  Neurological: Negative for tingling, weakness, numbness and paresthesias.  All other systems reviewed and are  negative.     Allergies  Review of patient's allergies indicates no known allergies.  Home Medications   Prior to Admission medications   Medication Sig Start Date End Date Taking? Authorizing Provider  albuterol (PROVENTIL HFA;VENTOLIN HFA) 108 (90 BASE) MCG/ACT inhaler Inhale 2 puffs into the lungs every 6 (six) hours as needed. For shortness of breath and wheezing 10/25/11  Yes Adeline C Viyuoh, MD  albuterol (PROVENTIL HFA;VENTOLIN HFA) 108 (90 BASE) MCG/ACT inhaler Inhale 2 puffs into the lungs every 4 (four) hours as needed for wheezing or shortness of breath. 06/14/15  Yes Shon Baton, MD  albuterol (PROVENTIL) (5 MG/ML) 0.5% nebulizer solution Take 0.5 mLs (2.5 mg total) by nebulization every 4 (four) hours as needed for wheezing. 10/25/11 12/07/15 Yes Adeline C Viyuoh, MD  fexofenadine (ALLEGRA) 30 MG tablet Take 30 mg by mouth daily.   Yes Historical Provider, MD  Fluticasone Furoate-Vilanterol (BREO ELLIPTA IN) Inhale into the lungs.   Yes Historical Provider, MD  levothyroxine (SYNTHROID, LEVOTHROID) 175 MCG tablet Take 175 mcg by mouth daily.    Yes Historical Provider, MD  benzonatate (TESSALON) 100 MG capsule Take 1 capsule (100 mg total) by mouth 3 (three) times daily as needed for cough. 06/14/15   Shon Baton, MD  cyclobenzaprine (FLEXERIL) 10 MG tablet Take 1 tablet (10 mg total) by mouth 2 (two) times daily as needed for muscle spasms. 12/07/15   Marily Memos, MD  ergocalciferol (VITAMIN D2) 50000 UNITS capsule Take 50,000 Units by mouth 2 (two)  times a week.    Historical Provider, MD  Fluticasone-Salmeterol (ADVAIR) 250-50 MCG/DOSE AEPB Inhale 1 puff into the lungs 2 (two) times daily. 10/25/11   Kela Millin, MD  meloxicam (MOBIC) 7.5 MG tablet Take 1 tablet (7.5 mg total) by mouth daily. 12/07/15 12/21/15  Marily Memos, MD  predniSONE (DELTASONE) 20 MG tablet Take 3 tablets (60 mg total) by mouth daily with breakfast. 06/14/15   Shon Baton, MD   BP 139/88  mmHg  Pulse 68  Temp(Src) 98.3 F (36.8 C) (Oral)  Resp 16  Ht  (1.676 m)  Wt 200 lb (90.719 kg)  BMI 32.30 kg/m2  SpO2 100% Physical Exam  Constitutional: She appears well-developed and well-nourished.  HENT:  Head: Normocephalic and atraumatic.  Eyes: Pupils are equal, round, and reactive to light.  Neck: Normal range of motion.  Cardiovascular: Normal rate and regular rhythm.   Pulmonary/Chest: Effort normal and breath sounds normal. No stridor. No respiratory distress.  Abdominal: Soft. She exhibits no distension. There is no tenderness. There is no rebound.  Musculoskeletal: Normal range of motion. She exhibits tenderness (right lower back). She exhibits no edema.  Neurological: She is alert.  Skin: Skin is warm and dry.  Nursing note and vitals reviewed.   ED Course  Procedures (including critical care time) Labs Review Labs Reviewed  URINALYSIS, ROUTINE W REFLEX MICROSCOPIC (NOT AT [facility])    Imaging Review No results found. I have personally reviewed and evaluated these images and lab results as part of my medical decision-making.   EKG Interpretation None      MDM   Final diagnoses:  Right-sided low back pain without sciatica    50 yo F w/ atraumatic back pain, likely msk. Doubt AAA, renal carcinoma, fracture, cellulitis, zoster or any other emergent causes based on H&P.     Marily Memos, MD 12/07/15 [phone]

## 2015-12-07 NOTE — ED Notes (Signed)
Right side back pain radiating down right leg- progressive worsening over the last 2 weeks

## 2016-06-04 ENCOUNTER — Emergency Department (HOSPITAL_BASED_OUTPATIENT_CLINIC_OR_DEPARTMENT_OTHER): Payer: BLUE CROSS/BLUE SHIELD

## 2016-06-04 ENCOUNTER — Encounter (HOSPITAL_BASED_OUTPATIENT_CLINIC_OR_DEPARTMENT_OTHER): Payer: Self-pay | Admitting: *Deleted

## 2016-06-04 ENCOUNTER — Inpatient Hospital Stay (HOSPITAL_BASED_OUTPATIENT_CLINIC_OR_DEPARTMENT_OTHER)
Admission: EM | Admit: 2016-06-04 | Discharge: 2016-06-06 | DRG: 203 | Disposition: A | Discharge status: Home / Self Care | Payer: BLUE CROSS/BLUE SHIELD | Attending: Internal Medicine | Admitting: Internal Medicine

## 2016-06-04 DIAGNOSIS — R0602 Shortness of breath: Secondary | ICD-10-CM | POA: Diagnosis not present

## 2016-06-04 DIAGNOSIS — J45901 Unspecified asthma with (acute) exacerbation: Secondary | ICD-10-CM | POA: Diagnosis not present

## 2016-06-04 DIAGNOSIS — J45909 Unspecified asthma, uncomplicated: Secondary | ICD-10-CM | POA: Diagnosis present

## 2016-06-04 DIAGNOSIS — Z79899 Other long term (current) drug therapy: Secondary | ICD-10-CM

## 2016-06-04 DIAGNOSIS — E876 Hypokalemia: Secondary | ICD-10-CM | POA: Diagnosis present

## 2016-06-04 DIAGNOSIS — E039 Hypothyroidism, unspecified: Secondary | ICD-10-CM | POA: Diagnosis present

## 2016-06-04 DIAGNOSIS — Z90710 Acquired absence of both cervix and uterus: Secondary | ICD-10-CM

## 2016-06-04 HISTORY — DX: Chronic obstructive pulmonary disease, unspecified: J44.9

## 2016-06-04 LAB — URINALYSIS, ROUTINE W REFLEX MICROSCOPIC
Bilirubin Urine: NEGATIVE
GLUCOSE, UA: NEGATIVE mg/dL
Hgb urine dipstick: NEGATIVE
Ketones, ur: NEGATIVE mg/dL
LEUKOCYTES UA: NEGATIVE
Nitrite: NEGATIVE
PH: 6 (ref 5.0–8.0)
PROTEIN: NEGATIVE mg/dL
SPECIFIC GRAVITY, URINE: 1.011 (ref 1.005–1.030)

## 2016-06-04 LAB — COMPREHENSIVE METABOLIC PANEL
ALBUMIN: 4.1 g/dL (ref 3.5–5.0)
ALT: 16 U/L (ref 14–54)
ANION GAP: 8 (ref 5–15)
AST: 24 U/L (ref 15–41)
Alkaline Phosphatase: 47 U/L (ref 38–126)
BUN: 12 mg/dL (ref 6–20)
CHLORIDE: 108 mmol/L (ref 101–111)
CO2: 23 mmol/L (ref 22–32)
Calcium: 9.1 mg/dL (ref 8.9–10.3)
Creatinine, Ser: 0.69 mg/dL (ref 0.44–1.00)
GFR calc Af Amer: >60 mL/min (ref >60)
GFR calc non Af Amer: >60 mL/min (ref >60)
GLUCOSE: 94 mg/dL (ref 65–99)
POTASSIUM: 3.3 mmol/L — AB (ref 3.5–5.1)
SODIUM: 139 mmol/L (ref 135–145)
TOTAL PROTEIN: 7.6 g/dL (ref 6.5–8.1)
Total Bilirubin: 0.7 mg/dL (ref 0.3–1.2)

## 2016-06-04 LAB — CBC WITH DIFFERENTIAL/PLATELET
BASOS ABS: 0 10*3/uL (ref 0.0–0.1)
BASOS PCT: 1 %
EOS PCT: 13 %
Eosinophils Absolute: 0.6 10*3/uL (ref 0.0–0.7)
HCT: 35.6 % — ABNORMAL LOW (ref 36.0–46.0)
Hemoglobin: 11.8 g/dL — ABNORMAL LOW (ref 12.0–15.0)
LYMPHS PCT: 29 %
Lymphs Abs: 1.4 10*3/uL (ref 0.7–4.0)
MCH: 28.3 pg (ref 26.0–34.0)
MCHC: 33.1 g/dL (ref 30.0–36.0)
MCV: 85.4 fL (ref 78.0–100.0)
MONO ABS: 0.4 10*3/uL (ref 0.1–1.0)
Monocytes Relative: 8 %
Neutro Abs: 2.3 10*3/uL (ref 1.7–7.7)
Neutrophils Relative %: 49 %
PLATELETS: 283 10*3/uL (ref 150–400)
RBC: 4.17 MIL/uL (ref 3.87–5.11)
RDW: 12.4 % (ref 11.5–15.5)
WBC: 4.8 10*3/uL (ref 4.0–10.5)

## 2016-06-04 LAB — TROPONIN I

## 2016-06-04 MED ORDER — POTASSIUM CHLORIDE CRYS ER 20 MEQ PO TBCR
20.0000 meq | EXTENDED_RELEASE_TABLET | Freq: Once | ORAL | Status: AC
  Administered 2016-06-04: 20 meq via ORAL
  Filled 2016-06-04: qty 1

## 2016-06-04 MED ORDER — ALBUTEROL (5 MG/ML) CONTINUOUS INHALATION SOLN
15.0000 mg/h | INHALATION_SOLUTION | RESPIRATORY_TRACT | Status: DC
  Administered 2016-06-04: 15 mg/h via RESPIRATORY_TRACT

## 2016-06-04 MED ORDER — IPRATROPIUM-ALBUTEROL 0.5-2.5 (3) MG/3ML IN SOLN
3.0000 mL | Freq: Once | RESPIRATORY_TRACT | Status: AC
  Administered 2016-06-04: 3 mL via RESPIRATORY_TRACT
  Filled 2016-06-04: qty 3

## 2016-06-04 MED ORDER — MAGNESIUM SULFATE IN D5W 1-5 GM/100ML-% IV SOLN
1.0000 g | Freq: Once | INTRAVENOUS | Status: DC
  Filled 2016-06-04: qty 100

## 2016-06-04 MED ORDER — MAGNESIUM SULFATE 50 % IJ SOLN
1.0000 g | Freq: Once | INTRAMUSCULAR | Status: AC
  Filled 2016-06-04: qty 2

## 2016-06-04 MED ORDER — METHYLPREDNISOLONE SODIUM SUCC 125 MG IJ SOLR
125.0000 mg | Freq: Once | INTRAMUSCULAR | Status: AC
  Administered 2016-06-04: 125 mg via INTRAVENOUS
  Filled 2016-06-04: qty 2

## 2016-06-04 MED ORDER — MAGNESIUM SULFATE IN D5W 1-5 GM/100ML-% IV SOLN
2.0000 g | Freq: Once | INTRAVENOUS | Status: AC
  Filled 2016-06-04: qty 200

## 2016-06-04 MED ORDER — MAGNESIUM SULFATE 2 GM/50ML IV SOLN
INTRAVENOUS | Status: AC
  Administered 2016-06-04: 2 g
  Filled 2016-06-04: qty 50

## 2016-06-04 MED ORDER — ALBUTEROL (5 MG/ML) CONTINUOUS INHALATION SOLN
INHALATION_SOLUTION | RESPIRATORY_TRACT | Status: AC
  Filled 2016-06-04: qty 20

## 2016-06-04 NOTE — ED Provider Notes (Signed)
[facility] Provider Note   CSN: [id number] Arrival date & time: 06/04/16  1747     History   Chief Complaint Chief Complaint  Patient presents with  . Shortness of Breath    HPI Maria Everett is a 50 y.o. female.  Pt has had sob for the past 2 days.  She said that she's been using her inhaler without improvement.  She has not had any fever.  She has had a cough.  She does not smoke, but has been around people at work who do smoke.      Past Medical History:  Diagnosis Date  . Asthma   . Bronchitis   . COPD (chronic obstructive pulmonary disease) (HCC)   . Grave's disease   . Hypothyroidism   . Shortness of breath 10/22/11   "all the time right now"    Patient Active Problem List   Diagnosis Date Noted  . Hypothyroidism 06/04/2016  . Hypokalemia 06/04/2016  . Asthma exacerbation 10/22/2011  . Thyroid disease 10/22/2011  . Grave's disease     Past Surgical History:  Procedure Laterality Date  . ABDOMINAL HYSTERECTOMY  11/2007  . APPENDECTOMY  11/2007  . CESAREAN SECTION  1986; 1987; 1993; 1997    OB History    No data available       Home Medications    Prior to Admission medications   Medication Sig Start Date End Date Taking? Authorizing Provider  montelukast (SINGULAIR) 10 MG tablet Take 10 mg by mouth at bedtime.   Yes Historical Provider, MD  albuterol (PROVENTIL HFA;VENTOLIN HFA) 108 (90 BASE) MCG/ACT inhaler Inhale 2 puffs into the lungs every 6 (six) hours as needed. For shortness of breath and wheezing 10/25/11   Kela MillinAdeline C Viyuoh, MD  albuterol (PROVENTIL HFA;VENTOLIN HFA) 108 (90 BASE) MCG/ACT inhaler Inhale 2 puffs into the lungs every 4 (four) hours as needed for wheezing or shortness of breath. 06/14/15   Shon Batonourtney F Horton, MD  albuterol (PROVENTIL) (5 MG/ML) 0.5% nebulizer solution Take 0.5 mLs (2.5 mg total) by nebulization every 4 (four) hours as needed for wheezing. 10/25/11 12/07/15  Kela MillinAdeline C Viyuoh, MD  benzonatate (TESSALON)  100 MG capsule Take 1 capsule (100 mg total) by mouth 3 (three) times daily as needed for cough. 06/14/15   Shon Batonourtney F Horton, MD  cyclobenzaprine (FLEXERIL) 10 MG tablet Take 1 tablet (10 mg total) by mouth 2 (two) times daily as needed for muscle spasms. 12/07/15   Marily MemosJason Mesner, MD  ergocalciferol (VITAMIN D2) 50000 UNITS capsule Take 50,000 Units by mouth 2 (two) times a week.    Historical Provider, MD  fexofenadine (ALLEGRA) 30 MG tablet Take 30 mg by mouth daily.    Historical Provider, MD  Fluticasone Furoate-Vilanterol (BREO ELLIPTA IN) Inhale into the lungs.    Historical Provider, MD  levothyroxine (SYNTHROID, LEVOTHROID) 175 MCG tablet Take 175 mcg by mouth daily.     Historical Provider, MD    Family History History reviewed. No pertinent family history.  Social History Social History  Substance Use Topics  . Smoking status: Never Smoker  . Smokeless tobacco: Never Used  . Alcohol use No     Allergies   Review of patient's allergies indicates no known allergies.   Review of Systems Review of Systems  Respiratory: Positive for cough, shortness of breath and wheezing.   All other systems reviewed and are negative.    Physical Exam Updated Vital Signs BP 145/93   Pulse 78   Temp  97.4 F (36.3 C) (Oral)   Resp 14   Ht 5\' 6"  (1.676 m)   Wt 200 lb (90.7 kg)   SpO2 96%   BMI 32.28 kg/m   Physical Exam  Constitutional: She is oriented to person, place, and time. She appears well-developed and well-nourished. She appears distressed.  HENT:  Head: Normocephalic and atraumatic.  Right Ear: External ear normal.  Left Ear: External ear normal.  Nose: Nose normal.  Mouth/Throat: Oropharynx is clear and moist.  Eyes: Conjunctivae and EOM are normal. Pupils are equal, round, and reactive to light.  Neck: Normal range of motion. Neck supple.  Cardiovascular: Normal rate, regular rhythm, normal heart sounds and intact distal pulses.   Pulmonary/Chest: She is in  respiratory distress. She has wheezes.  Abdominal: Soft. Bowel sounds are normal.  Musculoskeletal: Normal range of motion.  Neurological: She is alert and oriented to person, place, and time.  Skin: Skin is warm and dry.  Psychiatric: She has a normal mood and affect. Her behavior is normal. Judgment and thought content normal.  Nursing note and vitals reviewed.    ED Treatments / Results  Labs (all labs ordered are listed, but only abnormal results are displayed) Labs Reviewed  CBC WITH DIFFERENTIAL/PLATELET - Abnormal; Notable for the following:       Result Value   Hemoglobin 11.8 (*)    HCT 35.6 (*)    All other components within normal limits  COMPREHENSIVE METABOLIC PANEL - Abnormal; Notable for the following:    Potassium 3.3 (*)    All other components within normal limits  TROPONIN I  URINALYSIS, ROUTINE W REFLEX MICROSCOPIC (NOT AT [facility])    EKG  EKG Interpretation  Date/Time:  Wednesday June 04 2016 18:29:36 EDT Ventricular Rate:  73 PR Interval:    QRS Duration: 89 QT Interval:  377 QTC Calculation: 416 R Axis:   64 Text Interpretation:  Sinus rhythm Abnormal T, consider ischemia, diffuse leads Baseline wander in lead(s) V6 No significant change since last tracing Confirmed by [facility] MD, Olamide Carattini ([id number]) on 06/04/2016 6:46:31 PM       Radiology Dg Chest Port 1 View  Result Date: 06/04/2016 CLINICAL DATA:  Shortness of breath EXAM: PORTABLE CHEST 1 VIEW COMPARISON:  01/09/2016 FINDINGS: Lungs are clear.  No pleural effusion or pneumothorax. Heart is top-normal in size. IMPRESSION: No evidence of acute cardiopulmonary disease. Electronically Signed   By: Charline Bills M.D.   On: 06/04/2016 18:32    Procedures Procedures (including critical care time)  Medications Ordered in ED Medications  albuterol (PROVENTIL, VENTOLIN) (5 MG/ML) 0.5% continuous inhalation solution (  Not Given 06/04/16 1815)  albuterol (PROVENTIL,VENTOLIN) solution continuous neb (0  mg/hr Nebulization Stopped 06/04/16 1853)  methylPREDNISolone sodium succinate (SOLU-MEDROL) 125 mg/2 mL injection 125 mg (125 mg Intravenous Given 06/04/16 1855)  ipratropium-albuterol (DUONEB) 0.5-2.5 (3) MG/3ML nebulizer solution 3 mL (3 mLs Nebulization Given 06/04/16 1931)  magnesium sulfate (IV Push/IM) injection 1 g (0 g Intravenous Return to [facility] 06/04/16 2007)  potassium chloride SA (K-DUR,KLOR-CON) CR tablet 20 mEq (20 mEq Oral Given 06/04/16 2005)  magnesium sulfate IVPB 2 g 200 mL (0 g Intravenous Return to [facility] 06/04/16 2007)  magnesium sulfate 2 GM/50ML IVPB (  Stopped 06/04/16 2108)     Initial Impression / Assessment and Plan / ED Course  I have reviewed the triage vital signs and the nursing notes.  Pertinent labs & imaging results that were available during my care of the patient were reviewed  by me and considered in my medical decision making (see chart for details).  Clinical Course   Pt has received multiple nebs and IV solu-medrol and she is still wheezing quite a bit.  She got up to walk to the bathroom and O2 sat dropped to 93% and she was very dyspneic.  She agreed to stay in the hospital.  She was d/w Dr. Clyde LundborgNiu who admit pt.  Final Clinical Impressions(s) / ED Diagnoses   Final diagnoses:  Asthma exacerbation  Hypokalemia    New Prescriptions New Prescriptions   No medications on file     Jacalyn LefevreJulie Meganne Rita, MD 06/04/16 2140

## 2016-06-04 NOTE — ED Triage Notes (Signed)
Pt C/o SOB x 2 days HX asthma PTA albuterol TX w/o relief

## 2016-06-04 NOTE — Progress Notes (Signed)
This is a no charge note  [facility] Particia NearingHaviland  50 year old lady with past medical history of asthma, hypothyroidism, who presents with cough and SOB for 2 days. Oxygen desaturation to 90%. Has wheezing on auscultation.  WBC 4.8, temperature normal, no tachycardia, his tachypnea, potassium 3.3, negative chest x-ray, negative troponin, negative urinalysis. Patient was given 20 mEq of potassium chloride and total of 3 g of magnesium sulfate. Pt is accepted to tele bed for obs.  Lorretta HarpXilin Velta Rockholt, MD  [facility] Pager [phone]  If 7PM-7AM, please contact night-coverage [email or web address] Password [email or web address] 06/04/2016, 9:52 PM

## 2016-06-05 ENCOUNTER — Encounter (HOSPITAL_COMMUNITY): Payer: Self-pay | Admitting: Internal Medicine

## 2016-06-05 DIAGNOSIS — E876 Hypokalemia: Secondary | ICD-10-CM

## 2016-06-05 DIAGNOSIS — E039 Hypothyroidism, unspecified: Secondary | ICD-10-CM

## 2016-06-05 DIAGNOSIS — J45901 Unspecified asthma with (acute) exacerbation: Secondary | ICD-10-CM | POA: Diagnosis present

## 2016-06-05 DIAGNOSIS — Z79899 Other long term (current) drug therapy: Secondary | ICD-10-CM | POA: Diagnosis not present

## 2016-06-05 DIAGNOSIS — R0602 Shortness of breath: Secondary | ICD-10-CM | POA: Diagnosis present

## 2016-06-05 DIAGNOSIS — Z90710 Acquired absence of both cervix and uterus: Secondary | ICD-10-CM | POA: Diagnosis not present

## 2016-06-05 LAB — BASIC METABOLIC PANEL
Anion gap: 7 (ref 5–15)
BUN: 12 mg/dL (ref 6–20)
CHLORIDE: 109 mmol/L (ref 101–111)
CO2: 21 mmol/L — ABNORMAL LOW (ref 22–32)
CREATININE: 0.74 mg/dL (ref 0.44–1.00)
Calcium: 9.5 mg/dL (ref 8.9–10.3)
GFR calc Af Amer: >60 mL/min (ref >60)
GLUCOSE: 128 mg/dL — AB (ref 65–99)
POTASSIUM: 4 mmol/L (ref 3.5–5.1)
Sodium: 137 mmol/L (ref 135–145)

## 2016-06-05 LAB — CBC
HEMATOCRIT: 36.8 % (ref 36.0–46.0)
Hemoglobin: 11.8 g/dL — ABNORMAL LOW (ref 12.0–15.0)
MCH: 28 pg (ref 26.0–34.0)
MCHC: 32.1 g/dL (ref 30.0–36.0)
MCV: 87.2 fL (ref 78.0–100.0)
Platelets: 296 10*3/uL (ref 150–400)
RBC: 4.22 MIL/uL (ref 3.87–5.11)
RDW: 12.8 % (ref 11.5–15.5)
WBC: 6.2 10*3/uL (ref 4.0–10.5)

## 2016-06-05 LAB — RESPIRATORY PANEL BY PCR
Adenovirus: NOT DETECTED
Bordetella pertussis: NOT DETECTED
CORONAVIRUS NL63-RVPPCR: NOT DETECTED
CORONAVIRUS OC43-RVPPCR: NOT DETECTED
Chlamydophila pneumoniae: NOT DETECTED
Coronavirus 229E: NOT DETECTED
Coronavirus HKU1: NOT DETECTED
INFLUENZA A-RVPPCR: NOT DETECTED
INFLUENZA B-RVPPCR: NOT DETECTED
METAPNEUMOVIRUS-RVPPCR: NOT DETECTED
MYCOPLASMA PNEUMONIAE-RVPPCR: NOT DETECTED
PARAINFLUENZA VIRUS 1-RVPPCR: NOT DETECTED
PARAINFLUENZA VIRUS 4-RVPPCR: NOT DETECTED
Parainfluenza Virus 2: NOT DETECTED
Parainfluenza Virus 3: NOT DETECTED
RESPIRATORY SYNCYTIAL VIRUS-RVPPCR: NOT DETECTED
RHINOVIRUS / ENTEROVIRUS - RVPPCR: NOT DETECTED

## 2016-06-05 MED ORDER — ALBUTEROL SULFATE (2.5 MG/3ML) 0.083% IN NEBU: 2.5000 mg | INHALATION_SOLUTION | RESPIRATORY_TRACT | Status: DC | PRN

## 2016-06-05 MED ORDER — LEVOTHYROXINE SODIUM 75 MCG PO TABS
175.0000 ug | ORAL_TABLET | Freq: Every day | ORAL | Status: DC
  Administered 2016-06-05 – 2016-06-06 (×2): 175 ug via ORAL
  Filled 2016-06-05 (×2): qty 1

## 2016-06-05 MED ORDER — LORATADINE 10 MG PO TABS
10.0000 mg | ORAL_TABLET | Freq: Every day | ORAL | Status: DC
  Administered 2016-06-05 – 2016-06-06 (×2): 10 mg via ORAL
  Filled 2016-06-05 (×3): qty 1

## 2016-06-05 MED ORDER — CYCLOBENZAPRINE HCL 10 MG PO TABS: 10.0000 mg | ORAL_TABLET | Freq: Two times a day (BID) | ORAL | Status: DC | PRN

## 2016-06-05 MED ORDER — ACETAMINOPHEN 650 MG RE SUPP: 650.0000 mg | Freq: Four times a day (QID) | RECTAL | Status: DC | PRN

## 2016-06-05 MED ORDER — ONDANSETRON HCL 4 MG PO TABS
4.0000 mg | ORAL_TABLET | Freq: Four times a day (QID) | ORAL | Status: DC | PRN
  Filled 2016-06-05: qty 1

## 2016-06-05 MED ORDER — MENTHOL 3 MG MT LOZG
1.0000 | LOZENGE | OROMUCOSAL | Status: DC | PRN
  Filled 2016-06-05: qty 9

## 2016-06-05 MED ORDER — METHYLPREDNISOLONE SODIUM SUCC 125 MG IJ SOLR
60.0000 mg | Freq: Two times a day (BID) | INTRAMUSCULAR | Status: DC
  Administered 2016-06-05 – 2016-06-06 (×3): 60 mg via INTRAVENOUS
  Filled 2016-06-05 (×5): qty 2

## 2016-06-05 MED ORDER — ACETAMINOPHEN 325 MG PO TABS
650.0000 mg | ORAL_TABLET | Freq: Four times a day (QID) | ORAL | Status: DC | PRN
  Administered 2016-06-05: 650 mg via ORAL
  Filled 2016-06-05: qty 2

## 2016-06-05 MED ORDER — SODIUM CHLORIDE 0.9% FLUSH
3.0000 mL | Freq: Two times a day (BID) | INTRAVENOUS | Status: DC
  Administered 2016-06-05 – 2016-06-06 (×4): 3 mL via INTRAVENOUS

## 2016-06-05 MED ORDER — AZITHROMYCIN 500 MG PO TABS
250.0000 mg | ORAL_TABLET | Freq: Every day | ORAL | Status: DC
  Filled 2016-06-05: qty 1

## 2016-06-05 MED ORDER — MONTELUKAST SODIUM 10 MG PO TABS
10.0000 mg | ORAL_TABLET | Freq: Every day | ORAL | Status: DC
  Administered 2016-06-05 (×2): 10 mg via ORAL
  Filled 2016-06-05 (×2): qty 1

## 2016-06-05 MED ORDER — IPRATROPIUM-ALBUTEROL 0.5-2.5 (3) MG/3ML IN SOLN
3.0000 mL | RESPIRATORY_TRACT | Status: DC
  Administered 2016-06-05 – 2016-06-06 (×8): 3 mL via RESPIRATORY_TRACT
  Filled 2016-06-05 (×7): qty 3

## 2016-06-05 MED ORDER — ENOXAPARIN SODIUM 40 MG/0.4ML ~~LOC~~ SOLN
40.0000 mg | Freq: Every day | SUBCUTANEOUS | Status: DC
  Administered 2016-06-05 – 2016-06-06 (×2): 40 mg via SUBCUTANEOUS
  Filled 2016-06-05 (×2): qty 0.4

## 2016-06-05 MED ORDER — ONDANSETRON HCL 4 MG/2ML IJ SOLN
4.0000 mg | Freq: Four times a day (QID) | INTRAMUSCULAR | Status: DC | PRN
  Administered 2016-06-05 (×2): 4 mg via INTRAVENOUS
  Filled 2016-06-05 (×2): qty 2

## 2016-06-05 MED ORDER — VITAMIN D (ERGOCALCIFEROL) 1.25 MG (50000 UNIT) PO CAPS
50000.0000 [IU] | ORAL_CAPSULE | ORAL | Status: DC
  Administered 2016-06-05: 50000 [IU] via ORAL
  Filled 2016-06-05: qty 1

## 2016-06-05 MED ORDER — DM-GUAIFENESIN ER 30-600 MG PO TB12
1.0000 | ORAL_TABLET | Freq: Two times a day (BID) | ORAL | Status: DC
  Administered 2016-06-05 – 2016-06-06 (×4): 1 via ORAL
  Filled 2016-06-05 (×4): qty 1

## 2016-06-05 MED ORDER — AZITHROMYCIN 500 MG PO TABS
500.0000 mg | ORAL_TABLET | Freq: Every day | ORAL | Status: AC
  Administered 2016-06-05: 500 mg via ORAL
  Filled 2016-06-05: qty 1

## 2016-06-05 NOTE — H&P (Signed)
History and Physical    Maria Everett ZOX:[id number] DOB: [birth year] DOA: 06/04/2016  Referring MD/NP/PA:   PCP: Darryl Lent, PA-C   Patient coming from:  The patient is coming from home.  At baseline, pt is independent for most of ADL.   Chief Complaint: Cough and shortness of breath  HPI: Maria Everett is a 50 y.o. female with medical history significant of asthma, hypothyroidism, who presents with cough and shortness of breath  Patient has been having shortness of breath and cough for 2 days, which has been progressively getting worse. She coughs up greenish colored sputum. She has mild pleuritic chest pain, which is aggravated by coughing. Chest pain is nonradiating, located in the front chest. No tenderness over calf areas. Patient does not have fever or chills. Patient denies nausea, vomiting, diarrhea, abdominal pain, symptoms of UTI or unilateral weakness.  ED Course: pt was found to have WBC 4.8, negative troponin, negative urinalysis, temperature normal, no tachycardia, his tachypnea, potassium 3.3. Renal function okay, negative chest x-ray. Patient is placed on telemetry bed for observation.  Review of Systems:   General: no fevers, chills, no changes in body weight, has fatigue HEENT: no blurry vision, hearing changes or sore throat Respiratory: has dyspnea, coughing, wheezing CV: has chest pain, no palpitations GI: no nausea, vomiting, abdominal pain, diarrhea, constipation GU: no dysuria, burning on urination, increased urinary frequency, hematuria  Ext: no leg edema Neuro: no unilateral weakness, numbness, or tingling, no vision change or hearing loss Skin: no rash MSK: No muscle spasm, no deformity, no limitation of range of movement in spin Heme: No easy bruising.  Travel history: No recent long distant travel.  Allergy: No Known Allergies  Past Medical History:  Diagnosis Date  . Asthma   . Bronchitis   . COPD (chronic obstructive pulmonary disease) (HCC)    . Grave's disease   . Hypothyroidism   . Shortness of breath 10/22/11   "all the time right now"    Past Surgical History:  Procedure Laterality Date  . ABDOMINAL HYSTERECTOMY  11/2007  . APPENDECTOMY  11/2007  . CESAREAN SECTION  1986; 1987; 1993; 1997    Social History:  reports that she has never smoked. She has never used smokeless tobacco. She reports that she does not drink alcohol or use drugs.  Family History:  Family History  Problem Relation Age of Onset  . Sarcoidosis Mother      Prior to Admission medications   Medication Sig Start Date End Date Taking? Authorizing Provider  montelukast (SINGULAIR) 10 MG tablet Take 10 mg by mouth at bedtime.   Yes Historical Provider, MD  albuterol (PROVENTIL HFA;VENTOLIN HFA) 108 (90 BASE) MCG/ACT inhaler Inhale 2 puffs into the lungs every 6 (six) hours as needed. For shortness of breath and wheezing 10/25/11   Kela Millin, MD  albuterol (PROVENTIL HFA;VENTOLIN HFA) 108 (90 BASE) MCG/ACT inhaler Inhale 2 puffs into the lungs every 4 (four) hours as needed for wheezing or shortness of breath. 06/14/15   Shon Baton, MD  albuterol (PROVENTIL) (5 MG/ML) 0.5% nebulizer solution Take 0.5 mLs (2.5 mg total) by nebulization every 4 (four) hours as needed for wheezing. 10/25/11 12/07/15  Kela Millin, MD  benzonatate (TESSALON) 100 MG capsule Take 1 capsule (100 mg total) by mouth 3 (three) times daily as needed for cough. 06/14/15   Shon Baton, MD  cyclobenzaprine (FLEXERIL) 10 MG tablet Take 1 tablet (10 mg total) by mouth 2 (two) times daily as  needed for muscle spasms. 12/07/15   Marily MemosJason Mesner, MD  ergocalciferol (VITAMIN D2) 50000 UNITS capsule Take 50,000 Units by mouth 2 (two) times a week.    Historical Provider, MD  fexofenadine (ALLEGRA) 30 MG tablet Take 30 mg by mouth daily.    Historical Provider, MD  Fluticasone Furoate-Vilanterol (BREO ELLIPTA IN) Inhale into the lungs.    Historical Provider, MD  levothyroxine  (SYNTHROID, LEVOTHROID) 175 MCG tablet Take 175 mcg by mouth daily.     Historical Provider, MD    Physical Exam: Vitals:   06/04/16 2230 06/04/16 2326 06/04/16 2337 06/05/16 0323  BP: 135/83  130/85   Pulse: 84  80 84  Resp: 22  18 18   Temp:   97.7 F (36.5 C)   TempSrc:   Oral   SpO2: 96%  95% 93%  Weight:  98.8 kg (217 lb 14.4 oz)    Height:  5\' 5"  (1.651 m)     General: Not in acute distress HEENT:       Eyes: PERRL, EOMI, no scleral icterus.       ENT: No discharge from the ears and nose, no pharynx injection, no tonsillar enlargement.        Neck: No JVD, no bruit, no mass felt. Heme: No neck lymph node enlargement. Cardiac: S1/S2, RRR, No murmurs, No gallops or rubs. Respiratory: diffused wheezing bilaterally. No rales or rubs. GI: Soft, nondistended, nontender, no rebound pain, no organomegaly, BS present. GU: No hematuria Ext: No pitting leg edema bilaterally. 2+DP/PT pulse bilaterally. Musculoskeletal: No joint deformities, No joint redness or warmth, no limitation of ROM in spin. Skin: No rashes.  Neuro: Alert, oriented X3, cranial nerves II-XII grossly intact, moves all extremities normally.  Psych: Patient is not psychotic, no suicidal or hemocidal ideation.  Labs on Admission: I have personally reviewed following labs and imaging studies  CBC:  Recent Labs Lab 06/04/16 1800  WBC 4.8  NEUTROABS 2.3  HGB 11.8*  HCT 35.6*  MCV 85.4  PLT 283   Basic Metabolic Panel:  Recent Labs Lab 06/04/16 1800  NA 139  K 3.3*  CL 108  CO2 23  GLUCOSE 94  BUN 12  CREATININE 0.69  CALCIUM 9.1   GFR: Estimated Creatinine Clearance: 100.1 mL/min (by C-G formula based on SCr of 0.8 mg/dL). Liver Function Tests:  Recent Labs Lab 06/04/16 1800  AST 24  ALT 16  ALKPHOS 47  BILITOT 0.7  PROT 7.6  ALBUMIN 4.1   No results for input(s): LIPASE, AMYLASE in the last 168 hours. No results for input(s): AMMONIA in the last 168 hours. Coagulation Profile: No  results for input(s): INR, PROTIME in the last 168 hours. Cardiac Enzymes:  Recent Labs Lab 06/04/16 1800  TROPONINI <0.03   BNP (last 3 results) No results for input(s): PROBNP in the last 8760 hours. HbA1C: No results for input(s): HGBA1C in the last 72 hours. CBG: No results for input(s): GLUCAP in the last 168 hours. Lipid Profile: No results for input(s): CHOL, HDL, LDLCALC, TRIG, CHOLHDL, LDLDIRECT in the last 72 hours. Thyroid Function Tests: No results for input(s): TSH, T4TOTAL, FREET4, T3FREE, THYROIDAB in the last 72 hours. Anemia Panel: No results for input(s): VITAMINB12, FOLATE, FERRITIN, TIBC, IRON, RETICCTPCT in the last 72 hours. Urine analysis:    Component Value Date/Time   COLORURINE YELLOW 06/04/2016 1820   APPEARANCEUR CLEAR 06/04/2016 1820   LABSPEC 1.011 06/04/2016 1820   PHURINE 6.0 06/04/2016 1820   GLUCOSEU NEGATIVE 06/04/2016 1820  HGBUR NEGATIVE 06/04/2016 1820   BILIRUBINUR NEGATIVE 06/04/2016 1820   KETONESUR NEGATIVE 06/04/2016 1820   PROTEINUR NEGATIVE 06/04/2016 1820   UROBILINOGEN 1.0 10/24/2011 1154   NITRITE NEGATIVE 06/04/2016 1820   LEUKOCYTESUR NEGATIVE 06/04/2016 1820   Sepsis Labs: @LABRCNTIP (procalcitonin:4,lacticidven:4) )No results found for this or any previous visit (from the past 240 hour(s)).   Radiological Exams on Admission: Dg Chest Port 1 View  Result Date: 06/04/2016 CLINICAL DATA:  Shortness of breath EXAM: PORTABLE CHEST 1 VIEW COMPARISON:  01/09/2016 FINDINGS: Lungs are clear.  No pleural effusion or pneumothorax. Heart is top-normal in size. IMPRESSION: No evidence of acute cardiopulmonary disease. Electronically Signed   By: Charline BillsSriyesh  Krishnan M.D.   On: 06/04/2016 18:32     EKG: Independently reviewed. Sinus rhythm, QTC 416, T-wave inversion in inferior leads and V3-V6, which existed in the previous EKG on 10/29/11 without significant change.  Assessment/Plan Principal Problem:   Asthma exacerbation Active  Problems:   Hypothyroidism   Hypokalemia   Asthma exacerbation: Patient's SOB, cough and wheezing are consistent with asthma exacerbation. No infiltration on chest x-ray. -will place on tele bed for obs -Nebulizers: scheduled Duoneb and prn albuterol -Solu-Medrol 60 mg IV bid  -Mucinex for cough  -Follow up blood culture x2, sputum culture, respiratory virus panel -Z-pak  Hypokalemia: K= 3.3 on admission. - Repleted - pt received total of 3 g of magnesium sulfate in ED  Hypothyroidism: Last TSH was not on record -Continue home Synthroid  DVT ppx: SQ Lovenox Code Status: Full code Family Communication: None at bed side. Disposition Plan:  Anticipate discharge back to previous home environment Consults called: none  Admission status: Obs / tele    Date of Service 06/05/2016    Lorretta HarpIU, Jenese Mischke [facility] Pager [phone]  If 7PM-7AM, please contact night-coverage [email or web address] Password [email or web address] 06/05/2016, 4:05 AM

## 2016-06-05 NOTE — Progress Notes (Signed)
PROGRESS NOTE  Maria Everett  ZOX:[id number] DOB: January 08, 1966 DOA: 06/04/2016 PCP: Darryl Lent, PA-C  Brief Narrative:   Maria Everett is a 50 y.o. female with medical history significant of asthma, hypothyroidism, who presented with cough, wheeze, shortness of breath, greenish colored sputum for two days, not responding to her home breathing treatments.  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  CXR demonstrated no evidence of pneumonia.  Admitted for asthma exacerbation.    Assessment & Plan:   Principal Problem:   Asthma exacerbation Active Problems:   Hypothyroidism   Hypokalemia  Asthma exacerbation: Patient's SOB, cough and wheezing are consistent with asthma exacerbation. No infiltration on chest x-ray. - d/c tele -Nebulizers: scheduled Duoneb and prn albuterol -Solu-Medrol 60 mg IV bid  -Mucinex for cough  -Follow up blood culture x2, sputum culture,  - respiratory virus panel neg - antibiotics not indicated for asthma exacerbation.  D/c azithromycin  Hypokalemia: K= 3.3 on admission. - Repleted - pt received total of 3 g of magnesium sulfate in ED  Hypothyroidism: Last TSH was not on record -Continue home Synthroid  DVT prophylaxis:  lovenox Code Status:  full Family Communication:  Patient alone Disposition Plan:  Home once able to perform basic ADLs without significant dyspnea   Consultants:   none  Procedures:  none  Antimicrobials:   azithro x 1     Subjective: Feels better since coming to hospital, but still wheezing.  Does not like to feel wheezy.  Had steroids a few weeks ago, but prior to that it had been a long time since she had needed prednisone.  No recent hospitalizations.  Exposed to pain fumes at work.    Objective: Vitals:   06/05/16 0818 06/05/16 1240 06/05/16 1337 06/05/16 1527  BP:   136/75   Pulse:   95   Resp:   19   Temp:   97.7 F (36.5 C)   TempSrc:      SpO2: 98% 94% 99% 99%  Weight:        Height:        Intake/Output Summary (Last 24 hours) at 06/05/16 1834 Last data filed at 06/05/16 1435  Gross per 24 hour  Intake              920 ml  Output             1400 ml  Net             -480 ml   Filed Weights   06/04/16 1750 06/04/16 2326  Weight: 90.7 kg (200 lb) 98.8 kg (217 lb 14.4 oz)    Examination:  General exam:  Adult female.  No acute distress.  HEENT:  NCAT, MMM Respiratory system: musical wheezing throughout, no focal rales or rhonchi Cardiovascular system: Regular rate and rhythm, normal S1/S2. No murmurs, rubs, gallops or clicks.  Warm extremities Gastrointestinal system: Normal active bowel sounds, soft, nondistended, nontender. MSK:  Normal tone and bulk, no lower extremity edema Neuro:  Grossly intact    Data Reviewed: I have personally reviewed following labs and imaging studies  CBC:  Recent Labs Lab 06/04/16 1800 06/05/16 0537  WBC 4.8 6.2  NEUTROABS 2.3  --   HGB 11.8* 11.8*  HCT 35.6* 36.8  MCV 85.4 87.2  PLT 283 296   Basic Metabolic Panel:  Recent Labs Lab 06/04/16 1800 06/05/16 0537  NA 139 137  K 3.3* 4.0  CL 108 109  CO2 23  21*  GLUCOSE 94 128*  BUN 12 12  CREATININE 0.69 0.74  CALCIUM 9.1 9.5   GFR: Estimated Creatinine Clearance: 100.1 mL/min (by C-G formula based on SCr of 0.8 mg/dL). Liver Function Tests:  Recent Labs Lab 06/04/16 1800  AST 24  ALT 16  ALKPHOS 47  BILITOT 0.7  PROT 7.6  ALBUMIN 4.1   No results for input(s): LIPASE, AMYLASE in the last 168 hours. No results for input(s): AMMONIA in the last 168 hours. Coagulation Profile: No results for input(s): INR, PROTIME in the last 168 hours. Cardiac Enzymes:  Recent Labs Lab 06/04/16 1800  TROPONINI <0.03   BNP (last 3 results) No results for input(s): PROBNP in the last 8760 hours. HbA1C: No results for input(s): HGBA1C in the last 72 hours. CBG: No results for input(s): GLUCAP in the last 168 hours. Lipid Profile: No results for  input(s): CHOL, HDL, LDLCALC, TRIG, CHOLHDL, LDLDIRECT in the last 72 hours. Thyroid Function Tests: No results for input(s): TSH, T4TOTAL, FREET4, T3FREE, THYROIDAB in the last 72 hours. Anemia Panel: No results for input(s): VITAMINB12, FOLATE, FERRITIN, TIBC, IRON, RETICCTPCT in the last 72 hours. Urine analysis:    Component Value Date/Time   COLORURINE YELLOW 06/04/2016 1820   APPEARANCEUR CLEAR 06/04/2016 1820   LABSPEC 1.011 06/04/2016 1820   PHURINE 6.0 06/04/2016 1820   GLUCOSEU NEGATIVE 06/04/2016 1820   HGBUR NEGATIVE 06/04/2016 1820   BILIRUBINUR NEGATIVE 06/04/2016 1820   KETONESUR NEGATIVE 06/04/2016 1820   PROTEINUR NEGATIVE 06/04/2016 1820   UROBILINOGEN 1.0 10/24/2011 1154   NITRITE NEGATIVE 06/04/2016 1820   LEUKOCYTESUR NEGATIVE 06/04/2016 1820   Sepsis Labs: @LABRCNTIP (procalcitonin:4,lacticidven:4)  ) Recent Results (from the past 240 hour(s))  Respiratory Panel by PCR     Status: None   Collection Time: 06/05/16  1:37 AM  Result Value Ref Range Status   Adenovirus NOT DETECTED NOT DETECTED Final   Coronavirus 229E NOT DETECTED NOT DETECTED Final   Coronavirus HKU1 NOT DETECTED NOT DETECTED Final   Coronavirus NL63 NOT DETECTED NOT DETECTED Final   Coronavirus OC43 NOT DETECTED NOT DETECTED Final   Metapneumovirus NOT DETECTED NOT DETECTED Final   Rhinovirus / Enterovirus NOT DETECTED NOT DETECTED Final   Influenza A NOT DETECTED NOT DETECTED Final   Influenza B NOT DETECTED NOT DETECTED Final   Parainfluenza Virus 1 NOT DETECTED NOT DETECTED Final   Parainfluenza Virus 2 NOT DETECTED NOT DETECTED Final   Parainfluenza Virus 3 NOT DETECTED NOT DETECTED Final   Parainfluenza Virus 4 NOT DETECTED NOT DETECTED Final   Respiratory Syncytial Virus NOT DETECTED NOT DETECTED Final   Bordetella pertussis NOT DETECTED NOT DETECTED Final   Chlamydophila pneumoniae NOT DETECTED NOT DETECTED Final   Mycoplasma pneumoniae NOT DETECTED NOT DETECTED Final       Radiology Studies: Dg Chest Port 1 View  Result Date: 06/04/2016 CLINICAL DATA:  Shortness of breath EXAM: PORTABLE CHEST 1 VIEW COMPARISON:  01/09/2016 FINDINGS: Lungs are clear.  No pleural effusion or pneumothorax. Heart is top-normal in size. IMPRESSION: No evidence of acute cardiopulmonary disease. Electronically Signed   By: Charline Bills M.D.   On: 06/04/2016 18:32     Scheduled Meds: . azithromycin  250 mg Oral QHS  . dextromethorphan-guaiFENesin  1 tablet Oral BID  . enoxaparin (LOVENOX) injection  40 mg Subcutaneous Daily  . ipratropium-albuterol  3 mL Nebulization Q4H  . levothyroxine  175 mcg Oral QAC breakfast  . loratadine  10 mg Oral Daily  . methylPREDNISolone  sodium succinate  60 mg Intravenous Q12H  . montelukast  10 mg Oral QHS  . sodium chloride flush  3 mL Intravenous Q12H  . Vitamin D (Ergocalciferol)  50,000 Units Oral Once per day on Mon Thu   Continuous Infusions:    LOS: 0 days    Time spent: 30 min    Renae FickleSHORT, Maria Hotz, MD [facility] Pager [phone]  If 7PM-7AM, please contact night-coverage [email or web address] Password [email or web address] 06/05/2016, 6:34 PM

## 2016-06-06 MED ORDER — WHITE PETROLATUM GEL
Status: AC
  Administered 2016-06-06: 0.2
  Filled 2016-06-06: qty 1

## 2016-06-06 MED ORDER — PREDNISONE 20 MG PO TABS: ORAL_TABLET | ORAL | 0 refills | Status: DC

## 2016-06-06 MED ORDER — INFLUENZA VAC SPLIT QUAD 0.5 ML IM SUSY
0.5000 mL | PREFILLED_SYRINGE | INTRAMUSCULAR | Status: AC | PRN
  Administered 2016-06-06: 0.5 mL via INTRAMUSCULAR
  Filled 2016-06-06: qty 0.5

## 2016-06-06 MED ORDER — IPRATROPIUM-ALBUTEROL 0.5-2.5 (3) MG/3ML IN SOLN
3.0000 mL | Freq: Four times a day (QID) | RESPIRATORY_TRACT | Status: DC
  Administered 2016-06-06: 3 mL via RESPIRATORY_TRACT
  Filled 2016-06-06: qty 3

## 2016-06-06 MED ORDER — FUROSEMIDE 10 MG/ML IJ SOLN
40.0000 mg | Freq: Once | INTRAMUSCULAR | Status: AC
  Administered 2016-06-06: 40 mg via INTRAVENOUS
  Filled 2016-06-06: qty 4

## 2016-06-06 MED ORDER — IPRATROPIUM-ALBUTEROL 0.5-2.5 (3) MG/3ML IN SOLN
3.0000 mL | Freq: Three times a day (TID) | RESPIRATORY_TRACT | Status: DC
  Administered 2016-06-06: 3 mL via RESPIRATORY_TRACT
  Filled 2016-06-06: qty 3

## 2016-06-06 NOTE — Discharge Summary (Addendum)
Physician Discharge Summary  Wynonia LawmanBernita Everett ZOX:[id number] DOB: [birth year] DOA: 06/04/2016  PCP: Darryl LentAYLOR, AMANDA, PA-C  Admit date: 06/04/2016 Discharge date: 06/06/2016  Admitted From: home  Disposition:  home  Recommendations for Outpatient Follow-up:  1. Follow up with Pulmonologist in 1-2 weeks 2. Please obtain BMP/CBC in one week  Home Health:  none  Equipment/Devices:  none  Discharge Condition:  Stable, improved CODE STATUS:  full  Diet recommendation:  Healthy heart   Brief/Interim Summary:   Maria Powellis a 50 y.o.femalewith medical history significant of asthma, hypothyroidism, who presented with cough, wheeze, shortness of breath, greenish colored sputum for two days, not responding to her home breathing treatments.  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  CXR demonstrated no evidence of pneumonia.  Admitted for asthma exacerbation.  Discharge Diagnoses:  Principal Problem:   Asthma exacerbation Active Problems:   Hypothyroidism   Hypokalemia  Asthma exacerbation: Patient's SOB,cough and wheezing are consistent with asthma exacerbation. No infiltrationon chest x-ray.  She was started on solumedrol, duonebs and given magnesium in the emergency department.  No indication for antibiotics given lack of infiltrate on CXR.  Respiratory viral panel was negative, however, I suspect her illness was triggered by both exposure to paint fumes and a virus as she had sore throat and vomiting during her stay.  Because she also had some possible vascular prominence on her initial CXR, I gave her one dose of lasix 40mg  IV once and she voided frequently.  Prior to discharge, she was able to walk the hall maintaining O2 saturations in the high 90s.  She was given a prescription for prednisone to continue at home and advised to see her primary care doctor early next week for reevaluation.  She rarely has asthma exacerbations, so I will not escalate her controlled  medications at this time.  She is currently on breo ellipta and singulair under the direction [place] [facility] [place].    Hypokalemia: K= 3.3on admission.  Repleted with oral potassium.    Hypothyroidism: stable, Continue home Synthroid  Discharge Instructions  Discharge Instructions    Call MD for:  difficulty breathing, headache or visual disturbances    Complete by:  As directed   Call MD for:  extreme fatigue    Complete by:  As directed   Call MD for:  hives    Complete by:  As directed   Call MD for:  persistant dizziness or light-headedness    Complete by:  As directed   Call MD for:  persistant nausea and vomiting    Complete by:  As directed   Call MD for:  severe uncontrolled pain    Complete by:  As directed   Call MD for:  temperature >100.4    Complete by:  As directed   Diet - low sodium heart healthy    Complete by:  As directed   Increase activity slowly    Complete by:  As directed       Medication List    TAKE these medications   albuterol (5 MG/ML) 0.5% nebulizer solution Commonly known as:  PROVENTIL Take 0.5 mLs (2.5 mg total) by nebulization every 4 (four) hours as needed for wheezing.   albuterol 108 (90 Base) MCG/ACT inhaler Commonly known as:  PROVENTIL HFA;VENTOLIN HFA Inhale 2 puffs into the lungs every 4 (four) hours as needed for wheezing or shortness of breath.   BREO ELLIPTA 100-25 MCG/INH Aepb Generic drug:  fluticasone  furoate-vilanterol Inhale 1 puff into the lungs 2 (two) times daily.   ergocalciferol 50000 units capsule Commonly known as:  VITAMIN D2 Take 50,000 Units by mouth 2 (two) times a week.   levothyroxine 175 MCG tablet Commonly known as:  SYNTHROID, LEVOTHROID Take 175 mcg by mouth daily.   montelukast 10 MG tablet Commonly known as:  SINGULAIR Take 10 mg by mouth at bedtime.   predniSONE 20 MG tablet Commonly known as:  DELTASONE Take 3 tabs daily for 5 days, then stop.      Follow-up Information     [facility] Follow up in 2 week(s).   Specialty:  Pulmonology Contact information: [address] [place] [address] [phone]       Darryl Lent, PA-C Follow up today.   Specialty:  Physician Assistant Why:  as needed Contact information: [address] [place] [address] [phone]          No Known Allergies  Consultations: none   Procedures/Studies: Dg Chest Port 1 View  Result Date: 06/04/2016 CLINICAL DATA:  Shortness of breath EXAM: PORTABLE CHEST 1 VIEW COMPARISON:  01/09/2016 FINDINGS: Lungs are clear.  No pleural effusion or pneumothorax. Heart is top-normal in size. IMPRESSION: No evidence of acute cardiopulmonary disease. Electronically Signed   By: Charline Bills M.D.   On: 06/04/2016 18:32   none   Subjective: Difficulty sleeping due to cough and wheezing, but walking to bathroom without dyspnea.  Had some vomiting yesterday.  Denies diarrhea.    Discharge Exam: Vitals:   06/06/16 0620 06/06/16 1419  BP: (!) 108/57 121/72  Pulse: 80 91  Resp: 16 16  Temp: 98 F (36.7 C) 98.3 F (36.8 C)   Vitals:   06/06/16 0021 06/06/16 0357 06/06/16 0620 06/06/16 1419  BP:   (!) 108/57 121/72  Pulse: 85 90 80 91  Resp: 16 16 16 16   Temp:   98 F (36.7 C) 98.3 F (36.8 C)  TempSrc:   Oral Oral  SpO2: 96% 96% 95% 92%  Weight:      Height:         General exam:  Adult female.  No acute distress.  HEENT:  NCAT, MMM Respiratory system: musical wheezing, no focal rales or rhonchi Cardiovascular system: Regular rate and rhythm, normal S1/S2. No murmurs, rubs, gallops or clicks.  Warm extremities Gastrointestinal system: Normal active bowel sounds, soft, nondistended, nontender. MSK:  Normal tone and bulk, no lower extremity edema Neuro:  Grossly intact    The results of significant diagnostics from this hospitalization (including imaging, microbiology, ancillary and laboratory) are listed below for  reference.     Microbiology: Recent Results (from the past 240 hour(s))  Respiratory Panel by PCR     Status: None   Collection Time: 06/05/16  1:37 AM  Result Value Ref Range Status   Adenovirus NOT DETECTED NOT DETECTED Final   Coronavirus 229E NOT DETECTED NOT DETECTED Final   Coronavirus HKU1 NOT DETECTED NOT DETECTED Final   Coronavirus NL63 NOT DETECTED NOT DETECTED Final   Coronavirus OC43 NOT DETECTED NOT DETECTED Final   Metapneumovirus NOT DETECTED NOT DETECTED Final   Rhinovirus / Enterovirus NOT DETECTED NOT DETECTED Final   Influenza A NOT DETECTED NOT DETECTED Final   Influenza B NOT DETECTED NOT DETECTED Final   Parainfluenza Virus 1 NOT DETECTED NOT DETECTED Final   Parainfluenza Virus 2 NOT DETECTED NOT DETECTED Final   Parainfluenza Virus 3 NOT DETECTED NOT DETECTED  Final   Parainfluenza Virus 4 NOT DETECTED NOT DETECTED Final   Respiratory Syncytial Virus NOT DETECTED NOT DETECTED Final   Bordetella pertussis NOT DETECTED NOT DETECTED Final   Chlamydophila pneumoniae NOT DETECTED NOT DETECTED Final   Mycoplasma pneumoniae NOT DETECTED NOT DETECTED Final     Labs: BNP (last 3 results) No results for input(s): BNP in the last 8760 hours. Basic Metabolic Panel:  Recent Labs Lab 06/04/16 1800 06/05/16 0537  NA 139 137  K 3.3* 4.0  CL 108 109  CO2 23 21*  GLUCOSE 94 128*  BUN 12 12  CREATININE 0.69 0.74  CALCIUM 9.1 9.5   Liver Function Tests:  Recent Labs Lab 06/04/16 1800  AST 24  ALT 16  ALKPHOS 47  BILITOT 0.7  PROT 7.6  ALBUMIN 4.1   No results for input(s): LIPASE, AMYLASE in the last 168 hours. No results for input(s): AMMONIA in the last 168 hours. CBC:  Recent Labs Lab 06/04/16 1800 06/05/16 0537  WBC 4.8 6.2  NEUTROABS 2.3  --   HGB 11.8* 11.8*  HCT 35.6* 36.8  MCV 85.4 87.2  PLT 283 296   Cardiac Enzymes:  Recent Labs Lab 06/04/16 1800  TROPONINI <0.03   BNP: Invalid input(s): POCBNP CBG: No results for  input(s): GLUCAP in the last 168 hours. D-Dimer No results for input(s): DDIMER in the last 72 hours. Hgb A1c No results for input(s): HGBA1C in the last 72 hours. Lipid Profile No results for input(s): CHOL, HDL, LDLCALC, TRIG, CHOLHDL, LDLDIRECT in the last 72 hours. Thyroid function studies No results for input(s): TSH, T4TOTAL, T3FREE, THYROIDAB in the last 72 hours.  Invalid input(s): FREET3 Anemia work up No results for input(s): VITAMINB12, FOLATE, FERRITIN, TIBC, IRON, RETICCTPCT in the last 72 hours. Urinalysis    Component Value Date/Time   COLORURINE YELLOW 06/04/2016 1820   APPEARANCEUR CLEAR 06/04/2016 1820   LABSPEC 1.011 06/04/2016 1820   PHURINE 6.0 06/04/2016 1820   GLUCOSEU NEGATIVE 06/04/2016 1820   HGBUR NEGATIVE 06/04/2016 1820   BILIRUBINUR NEGATIVE 06/04/2016 1820   KETONESUR NEGATIVE 06/04/2016 1820   PROTEINUR NEGATIVE 06/04/2016 1820   UROBILINOGEN 1.0 10/24/2011 1154   NITRITE NEGATIVE 06/04/2016 1820   LEUKOCYTESUR NEGATIVE 06/04/2016 1820   Sepsis Labs Invalid input(s): PROCALCITONIN,  WBC,  LACTICIDVEN   Time coordinating discharge: Over 30 minutes  SIGNED:   Renae Fickle, MD  [facility] 06/06/2016, 4:54 PM Pager   If 7PM-7AM, please contact night-coverage [email or web address] Password [email or web address]

## 2016-06-06 NOTE — Progress Notes (Signed)
Pt given discharge instructions, prescriptions, and care notes. Pt verbalized understanding AEB no further questions or concerns at this time. IV was discontinued, no redness, pain, or swelling noted at this time. Pt left the floor via wheelchair with staff in stable condition. 

## 2016-06-06 NOTE — Discharge Instructions (Signed)
Asthma, Adult Asthma is a condition of the lungs in which the airways tighten and narrow. Asthma can make it hard to breathe. Asthma cannot be cured, but medicine and lifestyle changes can help control it. Asthma may be started (triggered) by:  Animal skin flakes (dander).  Dust.  Cockroaches.  Pollen.  Mold.  Smoke.  Cleaning products.  Hair sprays or aerosol sprays.  Paint fumes or strong smells.  Cold air, weather changes, and winds.  Crying or laughing hard.  Stress.  Certain medicines or drugs.  Foods, such as dried fruit, potato chips, and sparkling grape juice.  Infections or conditions (colds, flu).  Exercise.  Certain medical conditions or diseases.  Exercise or tiring activities. HOME CARE   Take medicine as told by your doctor.  Use a peak flow meter as told by your doctor. A peak flow meter is a tool that measures how well the lungs are working.  Record and keep track of the peak flow meter's readings.  Understand and use the asthma action plan. An asthma action plan is a written plan for taking care of your asthma and treating your attacks.  To help prevent asthma attacks:  Do not smoke. Stay away from secondhand smoke.  Change your heating and air conditioning filter often.  Limit your use of fireplaces and wood stoves.  Get rid of pests (such as roaches and mice) and their droppings.  Throw away plants if you see mold on them.  Clean your floors. Dust regularly. Use cleaning products that do not smell.  Have someone vacuum when you are not home. Use a vacuum cleaner with a HEPA filter if possible.  Replace carpet with wood, tile, or vinyl flooring. Carpet can trap animal skin flakes and dust.  Use allergy-proof pillows, mattress covers, and box spring covers.  Wash bed sheets and blankets every week in hot water and dry them in a dryer.  Use blankets that are made of polyester or cotton.  Clean bathrooms and kitchens with bleach.  If possible, have someone repaint the walls in these rooms with mold-resistant paint. Keep out of the rooms that are being cleaned and painted.  Wash hands often. GET HELP IF:  You have make a whistling sound when breaking (wheeze), have shortness of breath, or have a cough even if taking medicine to prevent attacks.  The colored mucus you cough up (sputum) is thicker than usual.  The colored mucus you cough up changes from clear or white to yellow, green, gray, or bloody.  You have problems from the medicine you are taking such as:  A rash.  Itching.  Swelling.  Trouble breathing.  You need reliever medicines more than 2-3 times a week.  Your peak flow measurement is still at 50-79% of your personal best after following the action plan for 1 hour.  You have a fever. GET HELP RIGHT AWAY IF:   You seem to be worse and are not responding to medicine during an asthma attack.  You are Torre Schaumburg of breath even at rest.  You get Vaun Hyndman of breath when doing very little activity.  You have trouble eating, drinking, or talking.  You have chest pain.  You have a fast heartbeat.  Your lips or fingernails start to turn blue.  You are light-headed, dizzy, or faint.  Your peak flow is less than 50% of your personal best.   This information is not intended to replace advice given to you by your health care provider. Make sure   you discuss any questions you have with your health care provider.   Document Released: 03/17/2008 Document Revised: 06/20/2015 Document Reviewed: 04/28/2013 Elsevier Interactive Patient Education 2016 Elsevier Inc.  

## 2016-06-06 NOTE — Care Management Note (Signed)
Case Management Note  Patient Details  Name: Wynonia LawmanBernita Powell MRN: [id number] Date of Birth: March 15, 1966  Subjective/Objective:                 Spoke with patient at the bedside. [place], lives alone, is independent. Patient has working nebulizer at home, no other DME. Still with DOE this AM, started IV Lasix today. On RA.  [facility] [place]   Action/Plan:  No CM needs at this time.   Expected Discharge Date:                  Expected Discharge Plan:  Home/Self Care  In-House Referral:  NA  Discharge planning Services  CM Consult  Post Acute Care Choice:  NA Choice offered to:  NA  DME Arranged:  N/A DME Agency:  NA  HH Arranged:  NA HH Agency:  NA  Status of Service:  Completed, signed off  If discussed at Long Length of Stay Meetings, dates discussed:    Additional Comments:  Lawerance SabalDebbie Thyra Yinger, RN 06/06/2016, 1:21 PM

## 2016-06-06 NOTE — Progress Notes (Signed)
SATURATION QUALIFICATIONS: (This note is used to comply with regulatory documentation for home oxygen)  Patient Saturations on Room Air at Rest = 99%  Patient Saturations on Room Air while Ambulating = 98%   Please briefly explain why patient needs home oxygen: N/A

## 2016-06-15 ENCOUNTER — Emergency Department (HOSPITAL_COMMUNITY): Payer: BLUE CROSS/BLUE SHIELD

## 2016-06-15 ENCOUNTER — Observation Stay (HOSPITAL_COMMUNITY)
Admission: EM | Admit: 2016-06-15 | Discharge: 2016-06-16 | Disposition: A | Discharge status: Home / Self Care | Payer: BLUE CROSS/BLUE SHIELD | Attending: Internal Medicine | Admitting: Internal Medicine

## 2016-06-15 ENCOUNTER — Encounter (HOSPITAL_COMMUNITY): Payer: Self-pay

## 2016-06-15 DIAGNOSIS — J441 Chronic obstructive pulmonary disease with (acute) exacerbation: Principal | ICD-10-CM | POA: Insufficient documentation

## 2016-06-15 DIAGNOSIS — R9431 Abnormal electrocardiogram [ECG] [EKG]: Secondary | ICD-10-CM | POA: Insufficient documentation

## 2016-06-15 DIAGNOSIS — R739 Hyperglycemia, unspecified: Secondary | ICD-10-CM | POA: Insufficient documentation

## 2016-06-15 DIAGNOSIS — J45909 Unspecified asthma, uncomplicated: Secondary | ICD-10-CM | POA: Diagnosis present

## 2016-06-15 DIAGNOSIS — E039 Hypothyroidism, unspecified: Secondary | ICD-10-CM | POA: Diagnosis present

## 2016-06-15 DIAGNOSIS — E876 Hypokalemia: Secondary | ICD-10-CM | POA: Diagnosis not present

## 2016-06-15 DIAGNOSIS — J4531 Mild persistent asthma with (acute) exacerbation: Secondary | ICD-10-CM

## 2016-06-15 DIAGNOSIS — Z7951 Long term (current) use of inhaled steroids: Secondary | ICD-10-CM | POA: Insufficient documentation

## 2016-06-15 DIAGNOSIS — E038 Other specified hypothyroidism: Secondary | ICD-10-CM | POA: Insufficient documentation

## 2016-06-15 DIAGNOSIS — J9600 Acute respiratory failure, unspecified whether with hypoxia or hypercapnia: Secondary | ICD-10-CM | POA: Diagnosis not present

## 2016-06-15 DIAGNOSIS — J45901 Unspecified asthma with (acute) exacerbation: Secondary | ICD-10-CM | POA: Diagnosis present

## 2016-06-15 DIAGNOSIS — Z79899 Other long term (current) drug therapy: Secondary | ICD-10-CM | POA: Diagnosis not present

## 2016-06-15 LAB — CBC WITH DIFFERENTIAL/PLATELET
BASOS ABS: 0 10*3/uL (ref 0.0–0.1)
Basophils Relative: 0 %
EOS PCT: 0 %
Eosinophils Absolute: 0 10*3/uL (ref 0.0–0.7)
HEMATOCRIT: 39.4 % (ref 36.0–46.0)
Hemoglobin: 12.8 g/dL (ref 12.0–15.0)
LYMPHS PCT: 6 %
Lymphs Abs: 1 10*3/uL (ref 0.7–4.0)
MCH: 28.1 pg (ref 26.0–34.0)
MCHC: 32.5 g/dL (ref 30.0–36.0)
MCV: 86.4 fL (ref 78.0–100.0)
MONO ABS: 0.9 10*3/uL (ref 0.1–1.0)
MONOS PCT: 6 %
NEUTROS ABS: 13.4 10*3/uL — AB (ref 1.7–7.7)
Neutrophils Relative %: 88 %
PLATELETS: 213 10*3/uL (ref 150–400)
RBC: 4.56 MIL/uL (ref 3.87–5.11)
RDW: 12.5 % (ref 11.5–15.5)
WBC: 15.3 10*3/uL — ABNORMAL HIGH (ref 4.0–10.5)

## 2016-06-15 LAB — BASIC METABOLIC PANEL
ANION GAP: 6 (ref 5–15)
BUN: 11 mg/dL (ref 6–20)
CO2: 20 mmol/L — AB (ref 22–32)
Calcium: 9.5 mg/dL (ref 8.9–10.3)
Chloride: 111 mmol/L (ref 101–111)
Creatinine, Ser: 0.77 mg/dL (ref 0.44–1.00)
GFR calc Af Amer: >60 mL/min (ref >60)
GLUCOSE: 132 mg/dL — AB (ref 65–99)
POTASSIUM: 3.1 mmol/L — AB (ref 3.5–5.1)
Sodium: 137 mmol/L (ref 135–145)

## 2016-06-15 LAB — MAGNESIUM: Magnesium: 2.1 mg/dL (ref 1.7–2.4)

## 2016-06-15 LAB — TSH: TSH: 0.41 u[IU]/mL (ref 0.350–4.500)

## 2016-06-15 LAB — PHOSPHORUS: Phosphorus: 1.9 mg/dL — ABNORMAL LOW (ref 2.5–4.6)

## 2016-06-15 LAB — I-STAT TROPONIN, ED: Troponin i, poc: 0.02 ng/mL (ref 0.00–0.08)

## 2016-06-15 LAB — BRAIN NATRIURETIC PEPTIDE: B Natriuretic Peptide: 59.5 pg/mL (ref 0.0–100.0)

## 2016-06-15 MED ORDER — BENZONATATE 100 MG PO CAPS
200.0000 mg | ORAL_CAPSULE | Freq: Three times a day (TID) | ORAL | Status: DC | PRN
  Administered 2016-06-16 (×2): 200 mg via ORAL
  Filled 2016-06-15 (×2): qty 2

## 2016-06-15 MED ORDER — MONTELUKAST SODIUM 10 MG PO TABS
10.0000 mg | ORAL_TABLET | Freq: Every day | ORAL | Status: DC
  Administered 2016-06-15: 10 mg via ORAL
  Filled 2016-06-15: qty 1

## 2016-06-15 MED ORDER — ALBUTEROL SULFATE (2.5 MG/3ML) 0.083% IN NEBU
5.0000 mg | INHALATION_SOLUTION | Freq: Once | RESPIRATORY_TRACT | Status: AC
  Administered 2016-06-15: 5 mg via RESPIRATORY_TRACT
  Filled 2016-06-15: qty 6

## 2016-06-15 MED ORDER — ALBUTEROL SULFATE (2.5 MG/3ML) 0.083% IN NEBU: 2.5000 mg | INHALATION_SOLUTION | RESPIRATORY_TRACT | Status: DC | PRN

## 2016-06-15 MED ORDER — ENOXAPARIN SODIUM 40 MG/0.4ML ~~LOC~~ SOLN
40.0000 mg | SUBCUTANEOUS | Status: DC
  Administered 2016-06-15: 40 mg via SUBCUTANEOUS
  Filled 2016-06-15: qty 0.4

## 2016-06-15 MED ORDER — POTASSIUM CHLORIDE CRYS ER 20 MEQ PO TBCR: 40.0000 meq | EXTENDED_RELEASE_TABLET | Freq: Once | ORAL | Status: DC

## 2016-06-15 MED ORDER — METHYLPREDNISOLONE SODIUM SUCC 125 MG IJ SOLR: 60.0000 mg | Freq: Four times a day (QID) | INTRAMUSCULAR | Status: DC

## 2016-06-15 MED ORDER — LEVOTHYROXINE SODIUM 175 MCG PO TABS
175.0000 ug | ORAL_TABLET | Freq: Every day | ORAL | Status: DC
  Administered 2016-06-16: 175 ug via ORAL
  Filled 2016-06-15: qty 1

## 2016-06-15 MED ORDER — BENZONATATE 100 MG PO CAPS
200.0000 mg | ORAL_CAPSULE | Freq: Once | ORAL | Status: AC
  Administered 2016-06-15: 200 mg via ORAL
  Filled 2016-06-15: qty 2

## 2016-06-15 MED ORDER — ACETAMINOPHEN 650 MG RE SUPP: 650.0000 mg | Freq: Four times a day (QID) | RECTAL | Status: DC | PRN

## 2016-06-15 MED ORDER — POTASSIUM CHLORIDE CRYS ER 20 MEQ PO TBCR
40.0000 meq | EXTENDED_RELEASE_TABLET | Freq: Once | ORAL | Status: AC
  Administered 2016-06-15: 40 meq via ORAL
  Filled 2016-06-15: qty 2

## 2016-06-15 MED ORDER — METHYLPREDNISOLONE SODIUM SUCC 125 MG IJ SOLR
60.0000 mg | Freq: Four times a day (QID) | INTRAMUSCULAR | Status: DC
  Administered 2016-06-15 – 2016-06-16 (×3): 60 mg via INTRAVENOUS
  Filled 2016-06-15 (×3): qty 2

## 2016-06-15 MED ORDER — ACETAMINOPHEN 325 MG PO TABS: 650.0000 mg | ORAL_TABLET | Freq: Four times a day (QID) | ORAL | Status: DC | PRN

## 2016-06-15 MED ORDER — LEVOFLOXACIN IN D5W 500 MG/100ML IV SOLN
500.0000 mg | Freq: Every day | INTRAVENOUS | Status: DC
  Administered 2016-06-15: 500 mg via INTRAVENOUS
  Filled 2016-06-15 (×3): qty 100

## 2016-06-15 NOTE — Progress Notes (Signed)
Pt admitted to the unit at 1752. Pt mental status is A&Ox4. Pt oriented to room, staff, and call bell. Skin is intact. Full assessment charted in [facility]. Call bell within reach. Visitor guidelines reviewed w/ pt and/or family.

## 2016-06-15 NOTE — ED Provider Notes (Signed)
[facility]-EMERGENCY DEPT Provider Note   CSN: [id number] Arrival date & time: 06/15/16  1234     History   Chief Complaint Chief Complaint  Patient presents with  . Asthma    HPI Maria Everett is a 50 y.o. female.  The history is provided by the patient and the EMS personnel. No language interpreter was used.  Asthma    Maria Everett is a 50 y.o. female who presents to the Emergency Department complaining of SOB.  She has a history of asthma and was recently hospitalized for an asthma exacerbation. Following hospital discharge she was feeling improved but did have some increased shortness of breath. Three days ago she began to develop increased nasal congestion and cough productive of green sputum. She saw her PCP yesterday and was started on medications for another asthma exacerbation and cold. This morning her symptoms became very severe and she felt that she could not move any air despite using her home nebulizer treatment. She denies any fevers. She does have some chest pain with coughing. No increased swelling in her legs. Following albuterol treatment and slight Medrol by EMS she feels slightly improved but still worse than her baseline.  Past Medical History:  Diagnosis Date  . Asthma   . Bronchitis   . COPD (chronic obstructive pulmonary disease) (HCC)   . Grave's disease   . Hypothyroidism   . Shortness of breath 10/22/11   "all the time right now"    Patient Active Problem List   Diagnosis Date Noted  . Acute respiratory failure (HCC) 06/15/2016  . Abnormal ECG 06/15/2016  . Acute hyperglycemia 06/15/2016  . Hypothyroidism 06/04/2016  . Hypokalemia 06/04/2016  . Acute asthmatic bronchitis 10/22/2011  . Thyroid disease 10/22/2011  . Grave's disease     Past Surgical History:  Procedure Laterality Date  . ABDOMINAL HYSTERECTOMY  11/2007  . APPENDECTOMY  11/2007  . CESAREAN SECTION  1986; 1987; 1993; 1997    OB History    No data available       Home  Medications    Prior to Admission medications   Medication Sig Start Date End Date Taking? Authorizing Provider  albuterol (PROVENTIL HFA;VENTOLIN HFA) 108 (90 BASE) MCG/ACT inhaler Inhale 2 puffs into the lungs every 4 (four) hours as needed for wheezing or shortness of breath. 06/14/15  Yes Shon Baton, MD  albuterol (PROVENTIL) (5 MG/ML) 0.5% nebulizer solution Take 0.5 mLs (2.5 mg total) by nebulization every 4 (four) hours as needed for wheezing. 10/25/11 06/15/16 Yes Adeline C Viyuoh, MD  fluticasone furoate-vilanterol (BREO ELLIPTA) 100-25 MCG/INH AEPB Inhale 1 puff into the lungs 2 (two) times daily.   Yes Historical Provider, MD  levothyroxine (SYNTHROID, LEVOTHROID) 175 MCG tablet Take 175 mcg by mouth daily.    Yes Historical Provider, MD  montelukast (SINGULAIR) 10 MG tablet Take 10 mg by mouth at bedtime.   Yes Historical Provider, MD  predniSONE (DELTASONE) 10 MG tablet Take 10-60 mg by mouth See admin instructions. Take 60 mg by mouth on day 1, take 50 mg by mouth on day 2, take 40 mg by mouth on day 3, take 30 mg by mouth on day 4, take 20 mg by mouth on day 5, take 10 mg by mouth on day 6 then stop 06/14/16  Yes Historical Provider, MD  predniSONE (DELTASONE) 20 MG tablet Take 3 tabs daily for 5 days, then stop. Patient not taking: Reported on 06/15/2016 06/06/16   Renae Fickle, MD    Family  History Family History  Problem Relation Age of Onset  . Sarcoidosis Mother     Social History Social History  Substance Use Topics  . Smoking status: Never Smoker  . Smokeless tobacco: Never Used  . Alcohol use No     Allergies   Review of patient's allergies indicates no known allergies.   Review of Systems Review of Systems  All other systems reviewed and are negative.    Physical Exam Updated Vital Signs BP 132/72   Pulse 102   Temp 98.1 F (36.7 C) (Oral)   Resp 26   SpO2 97%   Physical Exam  Constitutional: She is oriented to person, place, and time. She  appears well-developed and well-nourished.  HENT:  Head: Normocephalic and atraumatic.  Cardiovascular: Normal rate and regular rhythm.   No murmur heard. Pulmonary/Chest: Effort normal. No respiratory distress.  Inspiratory and expiratory wheezes with decreased air movement in bilateral bases.  Abdominal: Soft. There is no tenderness. There is no rebound and no guarding.  Musculoskeletal: She exhibits no edema or tenderness.  Neurological: She is alert and oriented to person, place, and time.  Skin: Skin is warm and dry.  Psychiatric: She has a normal mood and affect. Her behavior is normal.  Nursing note and vitals reviewed.    ED Treatments / Results  Labs (all labs ordered are listed, but only abnormal results are displayed) Labs Reviewed  BASIC METABOLIC PANEL - Abnormal; Notable for the following:       Result Value   Potassium 3.1 (*)    CO2 20 (*)    Glucose, Bld 132 (*)    All other components within normal limits  CBC WITH DIFFERENTIAL/PLATELET - Abnormal; Notable for the following:    WBC 15.3 (*)    Neutro Abs 13.4 (*)    All other components within normal limits  BRAIN NATRIURETIC PEPTIDE  TSH  HEMOGLOBIN A1C  I-STAT TROPOININ, ED    EKG  EKG Interpretation None       Radiology Dg Chest Port 1 View  Result Date: 06/15/2016 CLINICAL DATA:  Shortness of breath.  Asthma. EXAM: PORTABLE CHEST 1 VIEW COMPARISON:  06/04/2016 FINDINGS: The heart size and mediastinal contours are within normal limits. Both lungs are clear. The visualized skeletal structures are unremarkable. IMPRESSION: No active disease. Electronically Signed   By: Myles Rosenthal M.D.   On: 06/15/2016 13:16    Procedures Procedures (including critical care time)  Medications Ordered in ED Medications  levofloxacin (LEVAQUIN) IVPB 500 mg (not administered)  methylPREDNISolone sodium succinate (SOLU-MEDROL) 125 mg/2 mL injection 60 mg (not administered)  albuterol (PROVENTIL) (5 MG/ML) 0.5%  nebulizer solution 2.5 mg (not administered)  levothyroxine (SYNTHROID, LEVOTHROID) tablet 175 mcg (not administered)  montelukast (SINGULAIR) tablet 10 mg (not administered)  potassium chloride SA (K-DUR,KLOR-CON) CR tablet 40 mEq (not administered)  albuterol (PROVENTIL) (2.5 MG/3ML) 0.083% nebulizer solution 5 mg (5 mg Nebulization Given 06/15/16 1443)  benzonatate (TESSALON) capsule 200 mg (200 mg Oral Given 06/15/16 1537)  albuterol (PROVENTIL) (2.5 MG/3ML) 0.083% nebulizer solution 5 mg (5 mg Nebulization Given 06/15/16 1629)     Initial Impression / Assessment and Plan / ED Course  I have reviewed the triage vital signs and the nursing notes.  Pertinent labs & imaging results that were available during my care of the patient were reviewed by me and considered in my medical decision making (see chart for details).  Clinical Course  Patient with history of asthma here with increased shortness  of breath despite her home treatments. She was recently started on prednisone taper for recurrent asthma. Patient with significant shortness on breath on ED arrival. Her symptoms did improve after multiple nebulizer treatments in the emergency department. On repeat assessment she does have improved air movement bilaterally but persistent end-expiratory wheezes. On ambulating short distances she has significant dyspnea with tachycardia to the 120s.   Plan to admit to the hospitalist service for observation for recurrent asthma exacerbation.  Final Clinical Impressions(s) / ED Diagnoses   Final diagnoses:  Asthma exacerbation    New Prescriptions New Prescriptions   No medications on file     Tilden FossaElizabeth Paymon Rosensteel, MD 06/15/16 1643

## 2016-06-15 NOTE — ED Triage Notes (Signed)
To room via EMS.  Pt d/c from hospital 06-06-16 for ashtma, pt caught cold last week and has had respiratory difficulties since, has been using neb treatments with no relief.  EMS gave Albuterol 5 mg/Atrovent .5 mg with some relief, on way to ED pt started having increased wheezing and second neb was started.  Neb to be finished in ED room, MD aware.  EMS gave SoluMedrol 125 mg.  EMS ekg abnormal.

## 2016-06-15 NOTE — H&P (Signed)
History and Physical    Maria Everett WJX:[id number] DOB: [birth year] DOA: 06/15/2016   PCP: Darryl Lent, PA-C   Patient coming from/Resides with: Private residence/lives alone  Chief Complaint: Soreness of breath, wheezing and green productive sputum 2 days  HPI: Maria Everett is a 50 y.o. female with medical history significant for chronic asthma on Singulair and nebs, history of Graves' disease now hypothyroid after treatment who presents to the hospital with 2 days of increasing asthma exacerbation symptoms with productive cough with green sputum. Patient was discharged on 8/25 with a prednisone taper for 5 days after being treated for asthma exacerbation. X-ray at that time without focal infiltrates and she did not require antibiotics. Patient completed prednisone taper on 8/30 but since developing recurrent asthma exacerbation symptoms went to her primary care physician and was started on another prednisone taper on 9/2. Unfortunately symptoms have not improved. She has had chills without fever and she has utilized her home nebs without any improvement in her symptoms, and as noted has developed productive green sputum.  ED Course:  Vital Signs: BP 132/72   Pulse 102   Temp 98.1 F (36.7 C) (Oral)   Resp 26   SpO2 97%   PCXR: No active disease or infiltrate Lab data: Sodium 137, potassium 3.1, chloride 111, CO2 20, BUN 11, creatinine 0.77, glucose 132, anion gap 6, poc troponin 0.02, WBC 15,300 with neutrophils 88% absolute neutrophils 13.4 in setting of recent prednisone, hemoglobin 12.8, platelets 213,000 Medications and treatments: Albuterol nebulizer 5 mg 1, Tessalon Perles 200 mg by mouth 1, albuterol neb 5 mg repeated 1  Review of Systems:  In addition to the HPI above,  No Fever, myalgias or other constitutional symptoms No Headache, changes with Vision or hearing, new weakness, tingling, numbness in any extremity, No problems swallowing food or Liquids,  indigestion/reflux No Chest pain, palpitations, orthopnea or DOE No Abdominal pain, N/V; no melena or hematochezia, no dark tarry stools No dysuria, hematuria or flank pain No new skin rashes, lesions, masses or bruises, No new joints pains-aches No recent weight loss No polyuria, polydypsia or polyphagia,   Past Medical History:  Diagnosis Date  . Asthma   . Bronchitis   . COPD (chronic obstructive pulmonary disease) (HCC)   . Grave's disease   . Hypothyroidism   . Shortness of breath 10/22/11   "all the time right now"    Past Surgical History:  Procedure Laterality Date  . ABDOMINAL HYSTERECTOMY  11/2007  . APPENDECTOMY  11/2007  . CESAREAN SECTION  1986; 1987; 1993; 1997    Social History   Social History  . Marital status: Married    Spouse name: N/A  . Number of children: N/A  . Years of education: N/A   Occupational History  . Not on file.   Social History Main Topics  . Smoking status: Never Smoker  . Smokeless tobacco: Never Used  . Alcohol use No  . Drug use: No  . Sexual activity: Not Currently    Birth control/ protection: Surgical   Other Topics Concern  . Not on file   Social History Narrative  . 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 Sarcoidosis Mother      Prior to Admission medications   Medication Sig Start Date End Date Taking? Authorizing Provider  albuterol (PROVENTIL  HFA;VENTOLIN HFA) 108 (90 BASE) MCG/ACT inhaler Inhale 2 puffs into the lungs every 4 (four) hours as needed for wheezing or shortness of breath. 06/14/15  Yes Shon Batonourtney F Horton, MD  albuterol (PROVENTIL) (5 MG/ML) 0.5% nebulizer solution Take 0.5 mLs (2.5 mg total) by nebulization every 4 (four) hours as needed for wheezing. 10/25/11 06/15/16 Yes Adeline C Viyuoh, MD  fluticasone furoate-vilanterol (BREO ELLIPTA) 100-25  MCG/INH AEPB Inhale 1 puff into the lungs 2 (two) times daily.   Yes Historical Provider, MD  levothyroxine (SYNTHROID, LEVOTHROID) 175 MCG tablet Take 175 mcg by mouth daily.    Yes Historical Provider, MD  montelukast (SINGULAIR) 10 MG tablet Take 10 mg by mouth at bedtime.   Yes Historical Provider, MD  predniSONE (DELTASONE) 10 MG tablet Take 10-60 mg by mouth See admin instructions. Take 60 mg by mouth on day 1, take 50 mg by mouth on day 2, take 40 mg by mouth on day 3, take 30 mg by mouth on day 4, take 20 mg by mouth on day 5, take 10 mg by mouth on day 6 then stop 06/14/16  Yes Historical Provider, MD  predniSONE (DELTASONE) 20 MG tablet Take 3 tabs daily for 5 days, then stop. Patient not taking: Reported on 06/15/2016 06/06/16   Renae FickleMackenzie Short, MD    Physical Exam: Vitals:   06/15/16 1257 06/15/16 1430 06/15/16 1500 06/15/16 1530  BP:  129/81 120/77 132/72  Pulse:  100 102 102  Resp:  23 (!) 27 26  Temp: 98.1 F (36.7 C)     TempSrc: Oral     SpO2:  97% 99% 97%      Constitutional: NAD, calm, comfortable Eyes: PERRL, lids and conjunctivae normal ENMT: Mucous membranes are moist. Posterior pharynx clear of any exudate or lesions.Normal dentition.  Neck: normal, supple, no masses, no thyromegaly Respiratory: Coarse to auscultation with mildly diminished sounds bilaterally as well as scattered expiratory wheezes more prominent in the mid fields, Normal respiratory effort. No accessory muscle use. Room air Cardiovascular: Regular (but occasionally tachycardic rate with activity) and rhythm, no murmurs / rubs / gallops. No extremity edema. 2+ pedal pulses. No carotid bruits.  Abdomen: no tenderness, no masses palpated. No hepatosplenomegaly. Bowel sounds positive.  Musculoskeletal: no clubbing / cyanosis. No joint deformity upper and lower extremities. Good ROM, no contractures. Normal muscle tone.  Skin: no rashes, lesions, ulcers. No induration Neurologic: CN 2-12 grossly intact.  Sensation intact, DTR normal. Strength 5/5 x all 4 extremities.  Psychiatric: Normal judgment and insight. Alert and oriented x 3. Normal mood.    Labs on Admission: I have personally reviewed following labs and imaging studies  CBC:  Recent Labs Lab 06/15/16 1243  WBC 15.3*  NEUTROABS 13.4*  HGB 12.8  HCT 39.4  MCV 86.4  PLT 213   Basic Metabolic Panel:  Recent Labs Lab 06/15/16 1243  NA 137  K 3.1*  CL 111  CO2 20*  GLUCOSE 132*  BUN 11  CREATININE 0.77  CALCIUM 9.5   GFR: Estimated Creatinine Clearance: 100.1 mL/min (by C-G formula based on SCr of 0.8 mg/dL). Liver Function Tests: No results for input(s): AST, ALT, ALKPHOS, BILITOT, PROT, ALBUMIN in the last 168 hours. No results for input(s): LIPASE, AMYLASE in the last 168 hours. No results for input(s): AMMONIA in the last 168 hours. Coagulation Profile: No results for input(s): INR, PROTIME in the last 168 hours. Cardiac Enzymes: No results for input(s): CKTOTAL, CKMB, CKMBINDEX, TROPONINI in the last 168  hours. BNP (last 3 results) No results for input(s): PROBNP in the last 8760 hours. HbA1C: No results for input(s): HGBA1C in the last 72 hours. CBG: No results for input(s): GLUCAP in the last 168 hours. Lipid Profile: No results for input(s): CHOL, HDL, LDLCALC, TRIG, CHOLHDL, LDLDIRECT in the last 72 hours. Thyroid Function Tests: No results for input(s): TSH, T4TOTAL, FREET4, T3FREE, THYROIDAB in the last 72 hours. Anemia Panel: No results for input(s): VITAMINB12, FOLATE, FERRITIN, TIBC, IRON, RETICCTPCT in the last 72 hours. Urine analysis:    Component Value Date/Time   COLORURINE YELLOW 06/04/2016 1820   APPEARANCEUR CLEAR 06/04/2016 1820   LABSPEC 1.011 06/04/2016 1820   PHURINE 6.0 06/04/2016 1820   GLUCOSEU NEGATIVE 06/04/2016 1820   HGBUR NEGATIVE 06/04/2016 1820   BILIRUBINUR NEGATIVE 06/04/2016 1820   KETONESUR NEGATIVE 06/04/2016 1820   PROTEINUR NEGATIVE 06/04/2016 1820    UROBILINOGEN 1.0 10/24/2011 1154   NITRITE NEGATIVE 06/04/2016 1820   LEUKOCYTESUR NEGATIVE 06/04/2016 1820   Sepsis Labs: @LABRCNTIP (procalcitonin:4,lacticidven:4) )No results found for this or any previous visit (from the past 240 hour(s)).   Radiological Exams on Admission: Dg Chest Port 1 View  Result Date: 06/15/2016 CLINICAL DATA:  Shortness of breath.  Asthma. EXAM: PORTABLE CHEST 1 VIEW COMPARISON:  06/04/2016 FINDINGS: The heart size and mediastinal contours are within normal limits. Both lungs are clear. The visualized skeletal structures are unremarkable. IMPRESSION: No active disease. Electronically Signed   By: Myles Rosenthal M.D.   On: 06/15/2016 13:16    EKG: (Independently reviewed) ED pending (but ordered by ER physician); previous admission EKG with normal sinus rhythm with inverted T waves in inferior lateral leads more prominent in the lateral leads concerning for possible LV strain versus normal physiological variant for this patient  Assessment/Plan Principal Problem:   Acute respiratory failure 2/2 Acute asthmatic bronchitis -Patient presents with recurrent asthma exacerbation symptoms now with productive green sputum without focal infiltrate, recently hospitalized last week and completed prednisone taper with reemergence of symptoms therefore concerning for asthmatic bronchitis versus tracheobronchitis -Empiric Levaquin -IV Solu-Medrol to break wheezing then transition to prednisone taper -Continue preadmission Singulair -Albuterol nebs as needed -Follow pulse oximetry and apply oxygen if saturations decrease to less than 92%  Active Problems:   Abnormal ECG -Previous EKG with LVH strain pattern versus normal physiological variant -Patient denies chest pain and recent troponin negative -With underlying asthma, middle-aged female patient and issues with weight gain and shortness of breath would like to rule out possible underlying congestive heart failure issues so  obtain echocardiogram and BNP -Weight beginning in February 2017 was 200 lbs and at time of discharge and despite receiving Lasix during previous admission weight was 216 lbs -Daily weights and strict intake and output    Acute hyperglycemia -Likely steroid-induced but given obesity check hemoglobin A1c    Hypothyroidism -Continue Synthroid -Chek TSH especially with weight gain    Acute Hypokalemia -Likely from repeated bronchodilator treatments -Oral replacement -Labs in a.m. -Check magnesium and phosphorus      DVT prophylaxis: Lovenox Code Status: Full  Family Communication: Family member at bedside with patient's permission  Disposition Plan: Anticipate discharge back to preadmission home environment once medically stable Consults called: None Admission status: Observation/medical floor    Jahzir Strohmeier L. ANP-BC [facility] Pager [phone]   If 7PM-7AM, please contact night-coverage [email or web address] Password [email or web address]  06/15/2016, 4:45 PM

## 2016-06-15 NOTE — ED Notes (Signed)
Helped pt to bathroom Pt stated she felt a little dizzy but said she was ok to walk to bathroom .  Pt is providing urine sample.

## 2016-06-16 DIAGNOSIS — J9601 Acute respiratory failure with hypoxia: Secondary | ICD-10-CM | POA: Diagnosis not present

## 2016-06-16 DIAGNOSIS — J4541 Moderate persistent asthma with (acute) exacerbation: Secondary | ICD-10-CM

## 2016-06-16 LAB — COMPREHENSIVE METABOLIC PANEL
ALK PHOS: 56 U/L (ref 38–126)
ALT: 14 U/L (ref 14–54)
AST: 24 U/L (ref 15–41)
Albumin: 3.3 g/dL — ABNORMAL LOW (ref 3.5–5.0)
Anion gap: 9 (ref 5–15)
BILIRUBIN TOTAL: 0.7 mg/dL (ref 0.3–1.2)
BUN: 11 mg/dL (ref 6–20)
CALCIUM: 9.7 mg/dL (ref 8.9–10.3)
CO2: 19 mmol/L — AB (ref 22–32)
CREATININE: 0.74 mg/dL (ref 0.44–1.00)
Chloride: 112 mmol/L — ABNORMAL HIGH (ref 101–111)
Glucose, Bld: 128 mg/dL — ABNORMAL HIGH (ref 65–99)
Potassium: 4.5 mmol/L (ref 3.5–5.1)
SODIUM: 140 mmol/L (ref 135–145)
TOTAL PROTEIN: 6.7 g/dL (ref 6.5–8.1)

## 2016-06-16 LAB — CBC
HCT: 38.8 % (ref 36.0–46.0)
Hemoglobin: 12.2 g/dL (ref 12.0–15.0)
MCH: 27.4 pg (ref 26.0–34.0)
MCHC: 31.4 g/dL (ref 30.0–36.0)
MCV: 87 fL (ref 78.0–100.0)
PLATELETS: 171 10*3/uL (ref 150–400)
RBC: 4.46 MIL/uL (ref 3.87–5.11)
RDW: 12.8 % (ref 11.5–15.5)
WBC: 20.1 10*3/uL — ABNORMAL HIGH (ref 4.0–10.5)

## 2016-06-16 MED ORDER — DOXYCYCLINE HYCLATE 100 MG PO CAPS: 100.0000 mg | ORAL_CAPSULE | Freq: Two times a day (BID) | ORAL | 0 refills | Status: DC

## 2016-06-16 MED ORDER — BENZONATATE 200 MG PO CAPS: 200.0000 mg | ORAL_CAPSULE | Freq: Three times a day (TID) | ORAL | 0 refills | Status: DC | PRN

## 2016-06-16 MED ORDER — PREDNISONE 10 MG (21) PO TBPK: ORAL_TABLET | ORAL | 0 refills | Status: DC

## 2016-06-16 MED ORDER — DOXYCYCLINE HYCLATE 100 MG PO TABS
100.0000 mg | ORAL_TABLET | Freq: Two times a day (BID) | ORAL | Status: DC
  Administered 2016-06-16: 100 mg via ORAL
  Filled 2016-06-16: qty 1

## 2016-06-16 NOTE — Progress Notes (Signed)
Patient discharge teaching given, including activity, diet, follow-up appoints, and medications. Patient verbalized understanding of all discharge instructions. IV access was d/c'd. Vitals are stable. Skin is intact except as charted in most recent assessments. Pt to be escorted out by NT, to be driven home by family. 

## 2016-06-16 NOTE — Care Management Note (Signed)
Case Management Note  Patient Details  Name: Maria Everett MRN: [id number] Date of Birth: Jun 22, 1966  Subjective/Objective:                 Patient in obs from home, independent, no CM needs identified.   Action/Plan:  Will DC to home self care today. Expected Discharge Date:                  Expected Discharge Plan:  Home/Self Care  In-House Referral:  NA  Discharge planning Services  CM Consult  Post Acute Care Choice:  NA Choice offered to:  NA  DME Arranged:  N/A DME Agency:  NA  HH Arranged:  NA HH Agency:  NA  Status of Service:  Completed, signed off  If discussed at Long Length of Stay Meetings, dates discussed:    Additional Comments:  Lawerance SabalDebbie Azreal Stthomas, RN 06/16/2016, 12:02 PM

## 2016-06-16 NOTE — Discharge Summary (Signed)
Physician Discharge Summary  Maria Everett WGN:[id number] DOB: 1966-04-14 DOA: 06/15/2016  PCP: Darryl Lent, PA-C  Admit date: 06/15/2016 Discharge date: 06/16/2016  Time spent: 35 minutes  Recommendations for Outpatient Follow-up:  1. Please follow-up on asthmatic symptoms, discharged on prednisone taper and doxycycline.   Discharge Diagnoses:  Principal Problem:   Acute asthmatic bronchitis Active Problems:   Hypothyroidism   Hypokalemia   Acute respiratory failure (HCC)   Abnormal ECG   Acute hyperglycemia   Discharge Condition: Stable  Diet recommendation: Regular diet  Filed Weights   06/15/16 1904  Weight: 98.7 kg (217 lb 11.2 oz)    History of present illness:  Maria Everett is a 50 y.o. female with medical history significant for chronic asthma on Singulair and nebs, history of Graves' disease now hypothyroid after treatment who presents to the hospital with 2 days of increasing asthma exacerbation symptoms with productive cough with green sputum. Patient was discharged on 8/25 with a prednisone taper for 5 days after being treated for asthma exacerbation. X-ray at that time without focal infiltrates and she did not require antibiotics. Patient completed prednisone taper on 8/30 but since developing recurrent asthma exacerbation symptoms went to her primary care physician and was started on another prednisone taper on 9/2. Unfortunately symptoms have not improved. She has had chills without fever and she has utilized her home nebs without any improvement in her symptoms, and as noted has developed productive green sputum.  Hospital Course:  Maria Everett is a pleasant 50 year old female with a past medical history of asthma, admitted to medicine service on 06/15/2016 when she presented with complaints of worsening shortness of breath, wheezing, associated yellow/green sputum production. She was recently treated for an asthmatic exacerbation. Given worsening of symptoms she  called EMS and brought to the emergency department. She was treated with nebulizer treatments and started on IV Solu-Medrol. She should market clinical improvement over the next 24 hours, on the following day asking to go home. She was breathing comfortably on room air. Chest x-ray did not reveal acute infiltrate. White count did go up to 20,000 from 15,000 although I think this likely represents a reactive leukocytosis. Clinical stability she was discharged to her house on 06/16/2016 on a prednisone taper and 1 week course of doxycycline.   Discharge Exam: Vitals:   06/15/16 2124 06/16/16 0521  BP: 122/68 115/66  Pulse: 86 70  Resp: 16 16  Temp: 97.7 F (36.5 C) 98 F (36.7 C)    General: No acute distress, breathing comfortably on room air Cardiovascular: Regular rate and rhythm normal S1-S2 Respiratory: Normal respiratory effort, few expiratory wheezes, good air movement otherwise Abdomen: Soft nontender nondistended Extremities: No edema  Discharge Instructions   Discharge Instructions    Call MD for:    Complete by:  As directed   Call MD for:  difficulty breathing, headache or visual disturbances    Complete by:  As directed   Call MD for:  extreme fatigue    Complete by:  As directed   Call MD for:  hives    Complete by:  As directed   Call MD for:  persistant dizziness or light-headedness    Complete by:  As directed   Call MD for:  persistant nausea and vomiting    Complete by:  As directed   Call MD for:  redness, tenderness, or signs of infection (pain, swelling, redness, odor or green/yellow discharge around incision site)    Complete by:  As directed  Call MD for:  severe uncontrolled pain    Complete by:  As directed   Call MD for:  temperature >100.4    Complete by:  As directed   Diet - low sodium heart healthy    Complete by:  As directed   Increase activity slowly    Complete by:  As directed     Current Discharge Medication List    START taking these  medications   Details  benzonatate (TESSALON) 200 MG capsule Take 1 capsule (200 mg total) by mouth 3 (three) times daily as needed for cough. Qty: 20 capsule, Refills: 0    doxycycline (VIBRAMYCIN) 100 MG capsule Take 1 capsule (100 mg total) by mouth 2 (two) times daily. Qty: 14 capsule, Refills: 0    predniSONE (STERAPRED UNI-PAK 21 TAB) 10 MG (21) TBPK tablet Take 6-5-4-3-2-1 tablets by mouth daily till gone. Qty: 21 tablet, Refills: 0      CONTINUE these medications which have NOT CHANGED   Details  albuterol (PROVENTIL HFA;VENTOLIN HFA) 108 (90 BASE) MCG/ACT inhaler Inhale 2 puffs into the lungs every 4 (four) hours as needed for wheezing or shortness of breath. Qty: 1 Inhaler, Refills: 0    albuterol (PROVENTIL) (5 MG/ML) 0.5% nebulizer solution Take 0.5 mLs (2.5 mg total) by nebulization every 4 (four) hours as needed for wheezing. Qty: 20 mL, Refills: 0    fluticasone furoate-vilanterol (BREO ELLIPTA) 100-25 MCG/INH AEPB Inhale 1 puff into the lungs 2 (two) times daily.    levothyroxine (SYNTHROID, LEVOTHROID) 175 MCG tablet Take 175 mcg by mouth daily.     montelukast (SINGULAIR) 10 MG tablet Take 10 mg by mouth at bedtime.      STOP taking these medications     predniSONE (DELTASONE) 10 MG tablet      predniSONE (DELTASONE) 20 MG tablet        No Known Allergies Follow-up Information    TAYLOR, AMANDA, PA-C Follow up in 1 week(s).   Specialty:  Physician Assistant Contact information: [address] [place] [address] [phone]            The results of significant diagnostics from this hospitalization (including imaging, microbiology, ancillary and laboratory) are listed below for reference.    Significant Diagnostic Studies: Dg Chest Port 1 View  Result Date: 06/15/2016 CLINICAL DATA:  Shortness of breath.  Asthma. EXAM: PORTABLE CHEST 1 VIEW COMPARISON:  06/04/2016 FINDINGS: The heart size and mediastinal contours are within normal  limits. Both lungs are clear. The visualized skeletal structures are unremarkable. IMPRESSION: No active disease. Electronically Signed   By: Myles Rosenthal M.D.   On: 06/15/2016 13:16   Dg Chest Port 1 View  Result Date: 06/04/2016 CLINICAL DATA:  Shortness of breath EXAM: PORTABLE CHEST 1 VIEW COMPARISON:  01/09/2016 FINDINGS: Lungs are clear.  No pleural effusion or pneumothorax. Heart is top-normal in size. IMPRESSION: No evidence of acute cardiopulmonary disease. Electronically Signed   By: Charline Bills M.D.   On: 06/04/2016 18:32    Microbiology: No results found for this or any previous visit (from the past 240 hour(s)).   Labs: Basic Metabolic Panel:  Recent Labs Lab 06/15/16 1243 06/15/16 1653 06/16/16 0314  NA 137  --  140  K 3.1*  --  4.5  CL 111  --  112*  CO2 20*  --  19*  GLUCOSE 132*  --  128*  BUN 11  --  11  CREATININE 0.77  --  0.74  CALCIUM 9.5  --  9.7  MG  --  2.1  --   PHOS  --  1.9*  --    Liver Function Tests:  Recent Labs Lab 06/16/16 0314  AST 24  ALT 14  ALKPHOS 56  BILITOT 0.7  PROT 6.7  ALBUMIN 3.3*   No results for input(s): LIPASE, AMYLASE in the last 168 hours. No results for input(s): AMMONIA in the last 168 hours. CBC:  Recent Labs Lab 06/15/16 1243 06/16/16 0314  WBC 15.3* 20.1*  NEUTROABS 13.4*  --   HGB 12.8 12.2  HCT 39.4 38.8  MCV 86.4 87.0  PLT 213 171   Cardiac Enzymes: No results for input(s): CKTOTAL, CKMB, CKMBINDEX, TROPONINI in the last 168 hours. BNP: BNP (last 3 results)  Recent Labs  06/15/16 1653  BNP 59.5    ProBNP (last 3 results) No results for input(s): PROBNP in the last 8760 hours.  CBG: No results for input(s): GLUCAP in the last 168 hours.     Signed:  Jeralyn BennettZAMORA, Torry Istre MD.  [facility] 06/16/2016, 11:59 AM

## 2016-06-17 LAB — HEMOGLOBIN A1C
HEMOGLOBIN A1C: 5.8 % — AB (ref 4.8–5.6)
MEAN PLASMA GLUCOSE: 120 mg/dL

## 2016-07-01 ENCOUNTER — Institutional Professional Consult (permissible substitution): Payer: BLUE CROSS/BLUE SHIELD | Admitting: Pulmonary Disease

## 2017-03-09 ENCOUNTER — Encounter (HOSPITAL_BASED_OUTPATIENT_CLINIC_OR_DEPARTMENT_OTHER): Payer: Self-pay | Admitting: *Deleted

## 2017-03-09 ENCOUNTER — Emergency Department (HOSPITAL_BASED_OUTPATIENT_CLINIC_OR_DEPARTMENT_OTHER)
Admission: EM | Admit: 2017-03-09 | Discharge: 2017-03-09 | Disposition: A | Discharge status: Home / Self Care | Payer: BLUE CROSS/BLUE SHIELD | Attending: Emergency Medicine | Admitting: Emergency Medicine

## 2017-03-09 ENCOUNTER — Emergency Department (HOSPITAL_BASED_OUTPATIENT_CLINIC_OR_DEPARTMENT_OTHER): Payer: BLUE CROSS/BLUE SHIELD

## 2017-03-09 DIAGNOSIS — J449 Chronic obstructive pulmonary disease, unspecified: Secondary | ICD-10-CM | POA: Diagnosis not present

## 2017-03-09 DIAGNOSIS — E039 Hypothyroidism, unspecified: Secondary | ICD-10-CM | POA: Diagnosis not present

## 2017-03-09 DIAGNOSIS — J45901 Unspecified asthma with (acute) exacerbation: Secondary | ICD-10-CM | POA: Insufficient documentation

## 2017-03-09 DIAGNOSIS — J069 Acute upper respiratory infection, unspecified: Secondary | ICD-10-CM | POA: Insufficient documentation

## 2017-03-09 DIAGNOSIS — Z79899 Other long term (current) drug therapy: Secondary | ICD-10-CM | POA: Insufficient documentation

## 2017-03-09 DIAGNOSIS — R0602 Shortness of breath: Secondary | ICD-10-CM | POA: Diagnosis present

## 2017-03-09 MED ORDER — IPRATROPIUM BROMIDE 0.02 % IN SOLN
0.5000 mg | Freq: Once | RESPIRATORY_TRACT | Status: AC
  Administered 2017-03-09: 0.5 mg via RESPIRATORY_TRACT
  Filled 2017-03-09: qty 2.5

## 2017-03-09 MED ORDER — ALBUTEROL SULFATE (2.5 MG/3ML) 0.083% IN NEBU
2.5000 mg | INHALATION_SOLUTION | Freq: Once | RESPIRATORY_TRACT | Status: AC
  Administered 2017-03-09: 2.5 mg via RESPIRATORY_TRACT
  Filled 2017-03-09: qty 3

## 2017-03-09 MED ORDER — PREDNISONE 20 MG PO TABS
40.0000 mg | ORAL_TABLET | Freq: Once | ORAL | Status: AC
  Administered 2017-03-09: 40 mg via ORAL
  Filled 2017-03-09: qty 2

## 2017-03-09 MED ORDER — IPRATROPIUM-ALBUTEROL 0.5-2.5 (3) MG/3ML IN SOLN
3.0000 mL | Freq: Once | RESPIRATORY_TRACT | Status: AC
  Administered 2017-03-09: 3 mL via RESPIRATORY_TRACT
  Filled 2017-03-09: qty 3

## 2017-03-09 MED ORDER — GUAIFENESIN-DM 100-10 MG/5ML PO SYRP: 5.0000 mL | ORAL_SOLUTION | Freq: Three times a day (TID) | ORAL | 0 refills | Status: DC | PRN

## 2017-03-09 MED ORDER — ALBUTEROL SULFATE (2.5 MG/3ML) 0.083% IN NEBU
5.0000 mg | INHALATION_SOLUTION | Freq: Once | RESPIRATORY_TRACT | Status: AC
  Administered 2017-03-09: 5 mg via RESPIRATORY_TRACT
  Filled 2017-03-09: qty 6

## 2017-03-09 MED ORDER — PREDNISONE 20 MG PO TABS: 40.0000 mg | ORAL_TABLET | Freq: Every day | ORAL | 0 refills | Status: DC

## 2017-03-09 NOTE — ED Provider Notes (Signed)
[facility] Provider Note   CSN: [id number] Arrival date & time: 03/09/17  0859     History   Chief Complaint Chief Complaint  Patient presents with  . Cough    HPI Maria Everett is a 51 y.o. female.  HPI Patient with cough over the last few days. Started with nasal congestion and nasal drainage. Then went into the chest. History of asthma and states she feels if her asthma is flaring up. No improvement with her Singulair. No real improvement today with her nebulizer. No fevers. Has had increased sputum production today. Past Medical History:  Diagnosis Date  . Asthma   . Bronchitis   . COPD (chronic obstructive pulmonary disease) (HCC)   . Grave's disease   . Hypothyroidism   . Shortness of breath 10/22/11   "all the time right now"    Patient Active Problem List   Diagnosis Date Noted  . Acute respiratory failure (HCC) 06/15/2016  . Abnormal ECG 06/15/2016  . Acute hyperglycemia 06/15/2016  . Hypothyroidism 06/04/2016  . Hypokalemia 06/04/2016  . Acute asthmatic bronchitis 10/22/2011  . Thyroid disease 10/22/2011  . Grave's disease     Past Surgical History:  Procedure Laterality Date  . ABDOMINAL HYSTERECTOMY  11/2007  . APPENDECTOMY  11/2007  . CESAREAN SECTION  1986; 1987; 1993; 1997    OB History    No data available       Home Medications    Prior to Admission medications   Medication Sig Start Date End Date Taking? Authorizing Provider  albuterol (PROVENTIL HFA;VENTOLIN HFA) 108 (90 BASE) MCG/ACT inhaler Inhale 2 puffs into the lungs every 4 (four) hours as needed for wheezing or shortness of breath. 06/14/15   Horton, Mayer Maskerourtney F, MD  albuterol (PROVENTIL) (5 MG/ML) 0.5% nebulizer solution Take 0.5 mLs (2.5 mg total) by nebulization every 4 (four) hours as needed for wheezing. 10/25/11 06/15/16  Viyuoh, Rolland BimlerAdeline C, MD  fluticasone furoate-vilanterol (BREO ELLIPTA) 100-25 MCG/INH AEPB Inhale 1 puff into the lungs 2 (two) times daily.     [provider]  guaiFENesin-dextromethorphan (ROBITUSSIN DM) 100-10 MG/5ML syrup Take 5 mLs by mouth 3 (three) times daily as needed for cough. 03/09/17   Benjiman CorePickering, Yenny Kosa, MD  levothyroxine (SYNTHROID, LEVOTHROID) 175 MCG tablet Take 175 mcg by mouth daily.     [provider]  montelukast (SINGULAIR) 10 MG tablet Take 10 mg by mouth at bedtime.    [provider]  predniSONE (DELTASONE) 20 MG tablet Take 2 tablets (40 mg total) by mouth daily. 03/10/17   Benjiman CorePickering, Noelly Lasseigne, MD    Family History Family History  Problem Relation Age of Onset  . Sarcoidosis Mother     Social History Social History  Substance Use Topics  . Smoking status: Never Smoker  . Smokeless tobacco: Never Used  . Alcohol use No     Allergies   Patient has no known allergies.   Review of Systems Review of Systems  Constitutional: Negative for appetite change and fever.  HENT: Positive for congestion.   Respiratory: Positive for cough, shortness of breath and wheezing.   Cardiovascular: Negative for chest pain.  Gastrointestinal: Negative for abdominal pain.  Genitourinary: Negative for dysuria.  Musculoskeletal: Negative for back pain.  Skin: Negative for rash.  Neurological: Negative for numbness.  Hematological: Negative for adenopathy.     Physical Exam Updated Vital Signs BP 124/83 (BP Location: Right Arm)   Pulse 66   Temp 98.9 F (37.2 C) (Oral)  Resp 18   Ht 5\' 6"  (1.676 m)   Wt 90.7 kg (200 lb)   SpO2 100%   BMI 32.28 kg/m   Physical Exam  Constitutional: She appears well-developed.  HENT:  Head: Normocephalic.  Neck: Neck supple.  Cardiovascular: Normal rate.   Pulmonary/Chest: Effort normal. No respiratory distress. She has wheezes. She has no rales. She exhibits no tenderness.  Abdominal: There is no tenderness.  Musculoskeletal: She exhibits no edema.  Neurological: No cranial nerve deficit.  Skin: Skin is warm. Capillary refill takes less than  2 seconds.  Psychiatric: She has a normal mood and affect.     ED Treatments / Results  Labs (all labs ordered are listed, but only abnormal results are displayed) Labs Reviewed - No data to display  EKG  EKG Interpretation None       Radiology Dg Chest 2 View  Result Date: 03/09/2017 CLINICAL DATA:  Cough and congestion EXAM: CHEST  2 VIEW COMPARISON:  06/15/2016 FINDINGS: The heart size and mediastinal contours are within normal limits. Both lungs are clear. The visualized skeletal structures are unremarkable. IMPRESSION: No active cardiopulmonary disease. Electronically Signed   By: Alcide Clever M.D.   On: 03/09/2017 09:47    Procedures Procedures (including critical care time)  Medications Ordered in ED Medications  ipratropium-albuterol (DUONEB) 0.5-2.5 (3) MG/3ML nebulizer solution 3 mL (3 mLs Nebulization Given 03/09/17 0919)  albuterol (PROVENTIL) (2.5 MG/3ML) 0.083% nebulizer solution 2.5 mg (2.5 mg Nebulization Given 03/09/17 0919)  albuterol (PROVENTIL) (2.5 MG/3ML) 0.083% nebulizer solution 5 mg (5 mg Nebulization Given 03/09/17 0952)  ipratropium (ATROVENT) nebulizer solution 0.5 mg (0.5 mg Nebulization Given 03/09/17 0952)  predniSONE (DELTASONE) tablet 40 mg (40 mg Oral Given 03/09/17 1021)     Initial Impression / Assessment and Plan / ED Course  I have reviewed the triage vital signs and the nursing notes.  Pertinent labs & imaging results that were available during my care of the patient were reviewed by me and considered in my medical decision making (see chart for details).     Patient was shortness of breath cough. Also asthma exacerbation. Well-appearing. X-ray does not show pneumonia. Will treat with steroids and cough medicine.  Final Clinical Impressions(s) / ED Diagnoses   Final diagnoses:  Upper respiratory tract infection, unspecified type  Exacerbation of asthma, unspecified asthma severity, unspecified whether persistent    New  Prescriptions New Prescriptions   GUAIFENESIN-DEXTROMETHORPHAN (ROBITUSSIN DM) 100-10 MG/5ML SYRUP    Take 5 mLs by mouth 3 (three) times daily as needed for cough.   PREDNISONE (DELTASONE) 20 MG TABLET    Take 2 tablets (40 mg total) by mouth daily.     Benjiman Core, MD 03/09/17 1153

## 2017-03-09 NOTE — ED Triage Notes (Signed)
Pt reports sneezing, congestion, nasal drip and cough x Thursday.

## 2018-04-02 ENCOUNTER — Encounter (HOSPITAL_BASED_OUTPATIENT_CLINIC_OR_DEPARTMENT_OTHER): Payer: Self-pay

## 2018-04-02 ENCOUNTER — Inpatient Hospital Stay (HOSPITAL_BASED_OUTPATIENT_CLINIC_OR_DEPARTMENT_OTHER)
Admission: EM | Admit: 2018-04-02 | Discharge: 2018-04-07 | DRG: 192 | Disposition: A | Discharge status: Home / Self Care | Payer: BLUE CROSS/BLUE SHIELD | Attending: Family Medicine | Admitting: Family Medicine

## 2018-04-02 ENCOUNTER — Other Ambulatory Visit: Payer: Self-pay

## 2018-04-02 DIAGNOSIS — J441 Chronic obstructive pulmonary disease with (acute) exacerbation: Secondary | ICD-10-CM | POA: Diagnosis present

## 2018-04-02 DIAGNOSIS — K5909 Other constipation: Secondary | ICD-10-CM | POA: Diagnosis present

## 2018-04-02 DIAGNOSIS — Z7951 Long term (current) use of inhaled steroids: Secondary | ICD-10-CM

## 2018-04-02 DIAGNOSIS — Z90710 Acquired absence of both cervix and uterus: Secondary | ICD-10-CM

## 2018-04-02 DIAGNOSIS — Z79890 Hormone replacement therapy (postmenopausal): Secondary | ICD-10-CM

## 2018-04-02 DIAGNOSIS — E876 Hypokalemia: Secondary | ICD-10-CM | POA: Diagnosis present

## 2018-04-02 DIAGNOSIS — E039 Hypothyroidism, unspecified: Secondary | ICD-10-CM | POA: Diagnosis present

## 2018-04-02 DIAGNOSIS — E079 Disorder of thyroid, unspecified: Secondary | ICD-10-CM | POA: Diagnosis present

## 2018-04-02 DIAGNOSIS — D72829 Elevated white blood cell count, unspecified: Secondary | ICD-10-CM | POA: Diagnosis present

## 2018-04-02 DIAGNOSIS — Z8489 Family history of other specified conditions: Secondary | ICD-10-CM

## 2018-04-02 MED ORDER — ALBUTEROL SULFATE (2.5 MG/3ML) 0.083% IN NEBU
INHALATION_SOLUTION | RESPIRATORY_TRACT | Status: AC
  Administered 2018-04-02: 5 mg via RESPIRATORY_TRACT
  Filled 2018-04-02: qty 6

## 2018-04-02 MED ORDER — ALBUTEROL SULFATE (2.5 MG/3ML) 0.083% IN NEBU
5.0000 mg | INHALATION_SOLUTION | Freq: Once | RESPIRATORY_TRACT | Status: AC
  Administered 2018-04-02 (×2): 5 mg via RESPIRATORY_TRACT

## 2018-04-02 NOTE — ED Triage Notes (Signed)
Pt presents with cough/SOB x 2 weeks. Hx of asthma- last albuterol 4hrs PTA. Wheezing on assessment.

## 2018-04-03 ENCOUNTER — Other Ambulatory Visit: Payer: Self-pay

## 2018-04-03 ENCOUNTER — Emergency Department (HOSPITAL_BASED_OUTPATIENT_CLINIC_OR_DEPARTMENT_OTHER): Payer: BLUE CROSS/BLUE SHIELD

## 2018-04-03 ENCOUNTER — Encounter (HOSPITAL_COMMUNITY): Payer: Self-pay | Admitting: Internal Medicine

## 2018-04-03 DIAGNOSIS — E876 Hypokalemia: Secondary | ICD-10-CM | POA: Diagnosis not present

## 2018-04-03 DIAGNOSIS — Z90710 Acquired absence of both cervix and uterus: Secondary | ICD-10-CM | POA: Diagnosis not present

## 2018-04-03 DIAGNOSIS — D72829 Elevated white blood cell count, unspecified: Secondary | ICD-10-CM | POA: Diagnosis not present

## 2018-04-03 DIAGNOSIS — E039 Hypothyroidism, unspecified: Secondary | ICD-10-CM | POA: Diagnosis not present

## 2018-04-03 DIAGNOSIS — K5909 Other constipation: Secondary | ICD-10-CM | POA: Diagnosis not present

## 2018-04-03 DIAGNOSIS — Z7951 Long term (current) use of inhaled steroids: Secondary | ICD-10-CM | POA: Diagnosis not present

## 2018-04-03 DIAGNOSIS — Z8489 Family history of other specified conditions: Secondary | ICD-10-CM | POA: Diagnosis not present

## 2018-04-03 DIAGNOSIS — J441 Chronic obstructive pulmonary disease with (acute) exacerbation: Principal | ICD-10-CM

## 2018-04-03 DIAGNOSIS — Z79890 Hormone replacement therapy (postmenopausal): Secondary | ICD-10-CM | POA: Diagnosis not present

## 2018-04-03 LAB — BASIC METABOLIC PANEL
ANION GAP: 12 (ref 5–15)
BUN: 13 mg/dL (ref 6–20)
CHLORIDE: 106 mmol/L (ref 101–111)
CO2: 22 mmol/L (ref 22–32)
Calcium: 8.9 mg/dL (ref 8.9–10.3)
Creatinine, Ser: 0.69 mg/dL (ref 0.44–1.00)
GFR calc Af Amer: >60 mL/min (ref >60)
GLUCOSE: 150 mg/dL — AB (ref 65–99)
POTASSIUM: 2.9 mmol/L — AB (ref 3.5–5.1)
SODIUM: 140 mmol/L (ref 135–145)

## 2018-04-03 LAB — CBC WITH DIFFERENTIAL/PLATELET
BASOS ABS: 0 10*3/uL (ref 0.0–0.1)
Basophils Relative: 1 %
EOS PCT: 19 %
Eosinophils Absolute: 1.2 10*3/uL — ABNORMAL HIGH (ref 0.0–0.7)
HCT: 29 % — ABNORMAL LOW (ref 36.0–46.0)
HEMOGLOBIN: 10 g/dL — AB (ref 12.0–15.0)
LYMPHS PCT: 30 %
Lymphs Abs: 1.9 10*3/uL (ref 0.7–4.0)
MCH: 33.4 pg (ref 26.0–34.0)
MCHC: 34.5 g/dL (ref 30.0–36.0)
MCV: 97 fL (ref 78.0–100.0)
Monocytes Absolute: 0.3 10*3/uL (ref 0.1–1.0)
Monocytes Relative: 6 %
NEUTROS PCT: 44 %
Neutro Abs: 2.8 10*3/uL (ref 1.7–7.7)
Platelets: 350 10*3/uL (ref 150–400)
RBC: 2.99 MIL/uL — AB (ref 3.87–5.11)
RDW: 13.3 % (ref 11.5–15.5)
WBC: 6.2 10*3/uL (ref 4.0–10.5)

## 2018-04-03 MED ORDER — IBUPROFEN 400 MG PO TABS: 400.0000 mg | ORAL_TABLET | Freq: Four times a day (QID) | ORAL | Status: DC | PRN

## 2018-04-03 MED ORDER — FLUTICASONE FUROATE-VILANTEROL 100-25 MCG/INH IN AEPB
1.0000 | INHALATION_SPRAY | Freq: Two times a day (BID) | RESPIRATORY_TRACT | Status: DC
  Administered 2018-04-03 – 2018-04-04 (×2): 1 via RESPIRATORY_TRACT
  Filled 2018-04-03: qty 28

## 2018-04-03 MED ORDER — SODIUM CHLORIDE 0.9% FLUSH: 3.0000 mL | INTRAVENOUS | Status: DC | PRN

## 2018-04-03 MED ORDER — ACETAMINOPHEN 650 MG RE SUPP: 650.0000 mg | Freq: Four times a day (QID) | RECTAL | Status: DC | PRN

## 2018-04-03 MED ORDER — MONTELUKAST SODIUM 10 MG PO TABS
10.0000 mg | ORAL_TABLET | Freq: Every day | ORAL | Status: DC
  Administered 2018-04-03 – 2018-04-07 (×4): 10 mg via ORAL
  Filled 2018-04-03 (×4): qty 1

## 2018-04-03 MED ORDER — ONDANSETRON HCL 4 MG PO TABS: 4.0000 mg | ORAL_TABLET | Freq: Four times a day (QID) | ORAL | Status: DC | PRN

## 2018-04-03 MED ORDER — POLYETHYLENE GLYCOL 3350 17 G PO PACK: 17.0000 g | PACK | Freq: Every day | ORAL | Status: DC | PRN

## 2018-04-03 MED ORDER — ONDANSETRON HCL 4 MG/2ML IJ SOLN
4.0000 mg | Freq: Four times a day (QID) | INTRAMUSCULAR | Status: DC | PRN
Start: 2018-04-03 — End: 2018-04-07
  Administered 2018-04-07: 4 mg via INTRAVENOUS
  Filled 2018-04-03: qty 2

## 2018-04-03 MED ORDER — LEVOTHYROXINE SODIUM 50 MCG PO TABS
175.0000 ug | ORAL_TABLET | Freq: Every day | ORAL | Status: DC
  Administered 2018-04-05 – 2018-04-07 (×3): 175 ug via ORAL
  Filled 2018-04-03 (×3): qty 1

## 2018-04-03 MED ORDER — METHYLPREDNISOLONE SODIUM SUCC 125 MG IJ SOLR
125.0000 mg | Freq: Once | INTRAMUSCULAR | Status: AC
  Administered 2018-04-03: 125 mg via INTRAVENOUS
  Filled 2018-04-03: qty 2

## 2018-04-03 MED ORDER — DOXYCYCLINE HYCLATE 100 MG PO TABS
100.0000 mg | ORAL_TABLET | Freq: Two times a day (BID) | ORAL | Status: DC
  Administered 2018-04-03 – 2018-04-07 (×9): 100 mg via ORAL
  Filled 2018-04-03 (×10): qty 1

## 2018-04-03 MED ORDER — METHYLPREDNISOLONE SODIUM SUCC 125 MG IJ SOLR
60.0000 mg | Freq: Three times a day (TID) | INTRAMUSCULAR | Status: DC
  Administered 2018-04-03 – 2018-04-05 (×7): 60 mg via INTRAVENOUS
  Filled 2018-04-03 (×7): qty 2

## 2018-04-03 MED ORDER — POTASSIUM CHLORIDE 20 MEQ PO PACK
20.0000 meq | PACK | Freq: Once | ORAL | Status: DC
  Filled 2018-04-03: qty 1

## 2018-04-03 MED ORDER — MAGNESIUM SULFATE 2 GM/50ML IV SOLN
2.0000 g | Freq: Once | INTRAVENOUS | Status: AC
  Administered 2018-04-03: 2 g via INTRAVENOUS
  Filled 2018-04-03: qty 50

## 2018-04-03 MED ORDER — SODIUM CHLORIDE 0.9% FLUSH
3.0000 mL | Freq: Two times a day (BID) | INTRAVENOUS | Status: DC
  Administered 2018-04-03 – 2018-04-07 (×8): 3 mL via INTRAVENOUS

## 2018-04-03 MED ORDER — IPRATROPIUM BROMIDE 0.02 % IN SOLN
0.5000 mg | Freq: Once | RESPIRATORY_TRACT | Status: AC
  Administered 2018-04-03: 0.5 mg via RESPIRATORY_TRACT
  Filled 2018-04-03: qty 2.5

## 2018-04-03 MED ORDER — BENZONATATE 100 MG PO CAPS
100.0000 mg | ORAL_CAPSULE | Freq: Three times a day (TID) | ORAL | Status: DC | PRN
  Administered 2018-04-03 – 2018-04-05 (×4): 100 mg via ORAL
  Filled 2018-04-03 (×4): qty 1

## 2018-04-03 MED ORDER — POTASSIUM CHLORIDE CRYS ER 20 MEQ PO TBCR
40.0000 meq | EXTENDED_RELEASE_TABLET | Freq: Once | ORAL | Status: AC
  Administered 2018-04-03: 40 meq via ORAL
  Filled 2018-04-03: qty 2

## 2018-04-03 MED ORDER — POTASSIUM CHLORIDE 20 MEQ PO PACK
20.0000 meq | PACK | Freq: Once | ORAL | Status: AC
Start: 2018-04-03 — End: 2018-04-03
  Administered 2018-04-03: 20 meq via ORAL
  Filled 2018-04-03: qty 1

## 2018-04-03 MED ORDER — SODIUM CHLORIDE 0.9 % IV SOLN: 250.0000 mL | INTRAVENOUS | Status: DC | PRN

## 2018-04-03 MED ORDER — IPRATROPIUM-ALBUTEROL 0.5-2.5 (3) MG/3ML IN SOLN
3.0000 mL | RESPIRATORY_TRACT | Status: DC | PRN
  Administered 2018-04-03: 3 mL via RESPIRATORY_TRACT
  Filled 2018-04-03: qty 3

## 2018-04-03 MED ORDER — ALBUTEROL (5 MG/ML) CONTINUOUS INHALATION SOLN
15.0000 mg/h | INHALATION_SOLUTION | Freq: Once | RESPIRATORY_TRACT | Status: AC
  Administered 2018-04-03: 15 mg/h via RESPIRATORY_TRACT
  Filled 2018-04-03: qty 20

## 2018-04-03 MED ORDER — ENOXAPARIN SODIUM 40 MG/0.4ML ~~LOC~~ SOLN
40.0000 mg | SUBCUTANEOUS | Status: DC
  Administered 2018-04-03 – 2018-04-07 (×4): 40 mg via SUBCUTANEOUS
  Filled 2018-04-03 (×4): qty 0.4

## 2018-04-03 MED ORDER — ACETAMINOPHEN 325 MG PO TABS: 650.0000 mg | ORAL_TABLET | Freq: Four times a day (QID) | ORAL | Status: DC | PRN

## 2018-04-03 MED ORDER — IPRATROPIUM-ALBUTEROL 0.5-2.5 (3) MG/3ML IN SOLN
3.0000 mL | Freq: Four times a day (QID) | RESPIRATORY_TRACT | Status: DC
  Administered 2018-04-03 – 2018-04-04 (×5): 3 mL via RESPIRATORY_TRACT
  Filled 2018-04-03 (×5): qty 3

## 2018-04-03 NOTE — ED Notes (Signed)
Alert, NAD, calm, interactive, resps e/u, speaking in clear complete sentences, no dyspnea noted, skin W&D, blood redrawn and sent to lab d/t hemolysis. Family at [facility].

## 2018-04-03 NOTE — ED Provider Notes (Signed)
[facility] EMERGENCY DEPARTMENT Provider Note   CSN: [id number] Arrival date & time: 04/02/18  2315     History   Chief Complaint Chief Complaint  Patient presents with  . Cough    HPI Maria Everett is a 52 y.o. female.  The history is provided by the patient.  Cough  This is a recurrent problem. Episode onset: 2 weeks. The problem occurs constantly. The problem has been gradually worsening. The cough is non-productive. There has been no fever. Associated symptoms include shortness of breath and wheezing. Pertinent negatives include no chest pain and no rhinorrhea. Treatments tried: albuterol. The treatment provided no relief. She is not a smoker. Her past medical history is significant for bronchitis and COPD.    Past Medical History:  Diagnosis Date  . Asthma   . Bronchitis   . COPD (chronic obstructive pulmonary disease) (HCC)   . Grave's disease   . Hypothyroidism   . Shortness of breath 10/22/11   "all the time right now"    Patient Active Problem List   Diagnosis Date Noted  . Acute respiratory failure (HCC) 06/15/2016  . Abnormal ECG 06/15/2016  . Acute hyperglycemia 06/15/2016  . Hypothyroidism 06/04/2016  . Hypokalemia 06/04/2016  . Acute asthmatic bronchitis 10/22/2011  . Thyroid disease 10/22/2011  . Grave's disease     Past Surgical History:  Procedure Laterality Date  . ABDOMINAL HYSTERECTOMY  11/2007  . APPENDECTOMY  11/2007  . CESAREAN SECTION  1986; 1987; 1993; 1997     OB History   None      Home Medications    Prior to Admission medications   Medication Sig Start Date End Date Taking? Authorizing Provider  albuterol (PROVENTIL HFA;VENTOLIN HFA) 108 (90 BASE) MCG/ACT inhaler Inhale 2 puffs into the lungs every 4 (four) hours as needed for wheezing or shortness of breath. 06/14/15   Horton, Mayer Masker, MD  albuterol (PROVENTIL) (5 MG/ML) 0.5% nebulizer solution Take 0.5 mLs (2.5 mg total) by nebulization every 4 (four) hours as  needed for wheezing. 10/25/11 06/15/16  Viyuoh, Rolland Bimler, MD  fluticasone furoate-vilanterol (BREO ELLIPTA) 100-25 MCG/INH AEPB Inhale 1 puff into the lungs 2 (two) times daily.    [provider]  guaiFENesin-dextromethorphan (ROBITUSSIN DM) 100-10 MG/5ML syrup Take 5 mLs by mouth 3 (three) times daily as needed for cough. 03/09/17   Benjiman Core, MD  levothyroxine (SYNTHROID, LEVOTHROID) 175 MCG tablet Take 175 mcg by mouth daily.     [provider]  montelukast (SINGULAIR) 10 MG tablet Take 10 mg by mouth at bedtime.    [provider]  predniSONE (DELTASONE) 20 MG tablet Take 2 tablets (40 mg total) by mouth daily. 03/10/17   Benjiman Core, MD    Family History Family History  Problem Relation Age of Onset  . Sarcoidosis Mother     Social History Social History   Tobacco Use  . Smoking status: Never Smoker  . Smokeless tobacco: Never Used  Substance Use Topics  . Alcohol use: No  . Drug use: No     Allergies   Patient has no known allergies.   Review of Systems Review of Systems  HENT: Negative for rhinorrhea.   Respiratory: Positive for cough, shortness of breath and wheezing.   Cardiovascular: Negative for chest pain.  All other systems reviewed and are negative.    Physical Exam Updated Vital Signs BP (!) 153/86 (BP Location: Right Arm)   Temp 97.9 F (36.6 C) (Oral)   Resp  19   Ht 5\' 5"  (1.651 m)   Wt 89.8 kg (198 lb)   SpO2 98%   BMI 32.95 kg/m   Physical Exam  Constitutional: She is oriented to person, place, and time. She appears well-developed and well-nourished. No distress.  HENT:  Head: Normocephalic and atraumatic.  Mouth/Throat: Oropharynx is clear and moist.  Eyes: Pupils are equal, round, and reactive to light. Conjunctivae and EOM are normal.  Neck: Normal range of motion. Neck supple.  Cardiovascular: Normal rate, regular rhythm and intact distal pulses.  No murmur heard. Pulmonary/Chest: Effort normal.  No accessory muscle usage. Tachypnea noted. No respiratory distress. She has wheezes. She has no rales.  Abdominal: Soft. She exhibits no distension. There is no tenderness. There is no rebound and no guarding.  Musculoskeletal: Normal range of motion. She exhibits no edema or tenderness.  Neurological: She is alert and oriented to person, place, and time.  Skin: Skin is warm and dry. No rash noted. No erythema.  Psychiatric: She has a normal mood and affect. Her behavior is normal.  Nursing note and vitals reviewed.    ED Treatments / Results  Labs (all labs ordered are listed, but only abnormal results are displayed) Labs Reviewed  CBC WITH DIFFERENTIAL/PLATELET - Abnormal; Notable for the following components:      Result Value   RBC 2.99 (*)    Hemoglobin 10.0 (*)    HCT 29.0 (*)    Eosinophils Absolute 1.2 (*)    All other components within normal limits  BASIC METABOLIC PANEL - Abnormal; Notable for the following components:   Potassium 2.9 (*)    Glucose, Bld 150 (*)    All other components within normal limits    EKG None  Radiology Dg Chest [place] 1 View  Result Date: 04/03/2018 CLINICAL DATA:  Wheezing and short of breath EXAM: PORTABLE CHEST 1 VIEW COMPARISON:  03/09/2017 FINDINGS: No acute consolidation or pleural effusion. Mild bronchitic changes. Normal heart size. No pneumothorax. IMPRESSION: No active disease.  Mild bronchitic changes Electronically Signed   By: Jasmine PangKim  Fujinaga M.D.   On: 04/03/2018 02:14    Procedures Procedures (including critical care time)  Medications Ordered in ED Medications  methylPREDNISolone sodium succinate (SOLU-MEDROL) 125 mg/2 mL injection 125 mg (has no administration in time range)  magnesium sulfate IVPB 2 g 50 mL (has no administration in time range)  albuterol (PROVENTIL) (2.5 MG/3ML) 0.083% nebulizer solution 5 mg (5 mg Nebulization Given 04/02/18 2329)  albuterol (PROVENTIL,VENTOLIN) solution continuous neb (15 mg/hr  Nebulization Given 04/03/18 0004)  ipratropium (ATROVENT) nebulizer solution 0.5 mg (0.5 mg Nebulization Given 04/03/18 0004)     Initial Impression / Assessment and Plan / ED Course  I have reviewed the triage vital signs and the nursing notes.  Pertinent labs & imaging results that were available during my care of the patient were reviewed by me and considered in my medical decision making (see chart for details).     Pt with typical asthma exacerbation  symptoms.  No infectious sx, productive cough or other complaints.  Wheezing on exam.  will give steroids, mag and albuterol/atrovent. After first treatment pt still significantly wheezing.  Will do hour long CAT with 15mg /hr of albuterol and recheck.  Low suspicion for PNA, CHF or ACS today.  1:22 AM Pt still wheezing diffusely.  Still on CAT.  Will recheck in hour but may need admission for COPD exacerbation.  Pt is still wheezing on exam despite above therapy.  No  acute distress at this time and sats are reassuring.  Labs without acute change except for hypokalemia of 2.9 which is most likely from albuterol.  Will replace orally.  Will admit for COPD exacerbation.  CXR without acute findings.  CRITICAL CARE Performed by: Lyall Faciane Total critical care time: 30 minutes Critical care time was exclusive of separately billable procedures and treating other patients. Critical care was necessary to treat or prevent imminent or life-threatening deterioration. Critical care was time spent personally by me on the following activities: development of treatment plan with patient and/or surrogate as well as nursing, discussions with consultants, evaluation of patient's response to treatment, examination of patient, obtaining history from patient or surrogate, ordering and performing treatments and interventions, ordering and review of laboratory studies, ordering and review of radiographic studies, pulse oximetry and re-evaluation of patient's  condition.  Final Clinical Impressions(s) / ED Diagnoses   Final diagnoses:  COPD with acute exacerbation [facility])    ED Discharge Orders    None       Gwyneth Sprout, MD 04/03/18 [phone]

## 2018-04-03 NOTE — ED Notes (Signed)
Report to [facility] 

## 2018-04-03 NOTE — ED Notes (Signed)
Pt informed of admission  Explained to pt waiting on bed   Pt comfortable  Family no longer at bedside  Resp even and nonlabored  No acute distress noted

## 2018-04-03 NOTE — Plan of Care (Signed)
Discussed with Dr. Anitra LauthPlunkett. 52 year old with COPD exacerbation.  O2 sats maintained on room air.  Potassium noted be 2.9 on lab work.  Given 125 mg of IV Solu-Medrol, 2 g of magnesium sulfate, 40 mEq of potassium chloride p.o., and multiple breathing treatments without relief of symptoms.  Accepted to a MedSurg bed.

## 2018-04-03 NOTE — ED Notes (Signed)
ED TO INPATIENT HANDOFF REPORT  Name/Age/Gender Maria Everett 52 y.o. female  Code Status Code Status History    Date Active Date Inactive Code Status Order ID Comments User Context   06/15/2016 1815 06/16/2016 1905 Full Code [id number]  Samella Parr, NP Inpatient   06/05/2016 0012 06/06/2016 2137 Full Code [id number]  Ivor Costa, MD Inpatient      Home/SNF/Other Home  Chief Complaint cough  Level of Care/Admitting Diagnosis ED Disposition    ED Disposition Condition [place]: [facility] [[id number]]  Level of Care: Med-Surg [16]  Diagnosis: COPD exacerbation [facility]) [[id number]]  Admitting Physician: Norval Morton [[id number]]  Attending Physician: Norval Morton [[id number]]  PT Class (Do Not Modify): Observation [104]  PT Acc Code (Do Not Modify): Observation [10022]       Medical History Past Medical History:  Diagnosis Date  . Asthma   . Bronchitis   . COPD (chronic obstructive pulmonary disease) ([place])   . Grave's disease   . Hypothyroidism   . Shortness of breath 10/22/11   "all the time right now"    Allergies No Known Allergies  IV Location/Drains/Wounds Patient Lines/Drains/Airways Status   Active Line/Drains/Airways    Name:   Placement date:   Placement time:   Site:   Days:   Peripheral IV 04/03/18 Right Hand   04/03/18    0043    Hand   less than 1          Labs/Imaging Results for orders placed or performed during the hospital encounter of 04/02/18 (from the past 48 hour(s))  CBC with Differential/Platelet     Status: Abnormal   Collection Time: 04/03/18  1:33 AM  Result Value Ref Range   WBC 6.2 4.0 - 10.5 K/uL   RBC 2.99 (L) 3.87 - 5.11 MIL/uL   Hemoglobin 10.0 (L) 12.0 - 15.0 g/dL   HCT 29.0 (L) 36.0 - 46.0 %   MCV 97.0 78.0 - 100.0 fL   MCH 33.4 26.0 - 34.0 pg   MCHC 34.5 30.0 - 36.0 g/dL   RDW 13.3 11.5 - 15.5 %   Platelets 350 150 - 400 K/uL   Neutrophils Relative % 44 %   Neutro Abs 2.8 1.7 - 7.7  K/uL   Lymphocytes Relative 30 %   Lymphs Abs 1.9 0.7 - 4.0 K/uL   Monocytes Relative 6 %   Monocytes Absolute 0.3 0.1 - 1.0 K/uL   Eosinophils Relative 19 %   Eosinophils Absolute 1.2 (H) 0.0 - 0.7 K/uL   Basophils Relative 1 %   Basophils Absolute 0.0 0.0 - 0.1 K/uL    Comment: Performed at [facility], [address] [place], [place] [address]  Basic metabolic panel     Status: Abnormal   Collection Time: 04/03/18  1:55 AM  Result Value Ref Range   Sodium 140 135 - 145 mmol/L   Potassium 2.9 (L) 3.5 - 5.1 mmol/L   Chloride 106 101 - 111 mmol/L   CO2 22 22 - 32 mmol/L   Glucose, Bld 150 (H) 65 - 99 mg/dL   BUN 13 6 - 20 mg/dL   Creatinine, Ser 0.69 0.44 - 1.00 mg/dL   Calcium 8.9 8.9 - 10.3 mg/dL   GFR calc non Af Amer >60 >60 mL/min   GFR calc Af Amer >60 >60 mL/min    Comment: (NOTE) The eGFR has been calculated using the CKD EPI equation. This calculation has  not been validated in all clinical situations. eGFR's persistently <60 mL/min signify possible Chronic Kidney Disease.    Anion gap 12 5 - 15    Comment: Performed at [facility], [address] [place], [place] [address]   Dg Chest [place] 1 View  Result Date: 04/03/2018 CLINICAL DATA:  Wheezing and short of breath EXAM: PORTABLE CHEST 1 VIEW COMPARISON:  03/09/2017 FINDINGS: No acute consolidation or pleural effusion. Mild bronchitic changes. Normal heart size. No pneumothorax. IMPRESSION: No active disease.  Mild bronchitic changes Electronically Signed   By: Donavan Foil M.D.   On: 04/03/2018 02:14    Pending Labs Unresulted Labs (From admission, onward)   None      Vitals/Pain Today's Vitals   04/03/18 0005 04/03/18 0143 04/03/18 0238 04/03/18 0311  BP:  115/68    Pulse:  (!) 103    Resp:  20    Temp:  97.8 F (36.6 C)    TempSrc:      SpO2: 98% 96%  96%  Weight:      Height:      PainSc:  0-No pain 0-No pain     Isolation Precautions No active  isolations  Medications Medications  ipratropium-albuterol (DUONEB) 0.5-2.5 (3) MG/3ML nebulizer solution 3 mL (3 mLs Nebulization Given 04/03/18 0310)  albuterol (PROVENTIL) (2.5 MG/3ML) 0.083% nebulizer solution 5 mg (5 mg Nebulization Given 04/02/18 2329)  methylPREDNISolone sodium succinate (SOLU-MEDROL) 125 mg/2 mL injection 125 mg (125 mg Intravenous Given 04/03/18 0044)  magnesium sulfate IVPB 2 g 50 mL (0 g Intravenous Stopped 04/03/18 0059)  albuterol (PROVENTIL,VENTOLIN) solution continuous neb (15 mg/hr Nebulization Given 04/03/18 0004)  ipratropium (ATROVENT) nebulizer solution 0.5 mg (0.5 mg Nebulization Given 04/03/18 0004)  potassium chloride SA (K-DUR,KLOR-CON) CR tablet 40 mEq (40 mEq Oral Given 04/03/18 0254)    Mobility walks

## 2018-04-03 NOTE — ED Notes (Signed)
Pt given a coke 

## 2018-04-03 NOTE — ED Notes (Signed)
Breathing treatment complete  Pt states she feels better but continues to have some tightness in her chest   Pt receiving portable chest xray at this time

## 2018-04-03 NOTE — H&P (Addendum)
History and Physical    Maria Everett ZHY:[id number] DOB: Apr 07, 1966 DOA: 04/02/2018  PCP: Darryl Lentaylor, Amanda, PA-C  Patient coming from: Home  Chief Complaint: SOB  HPI: Maria Everett is a 52 y.o. female with medical history significant of COPD, hypothyroidism who presents with shortness of breath worsening since Tuesday.  She states that her last COPD exacerbation was about 2 years ago.  She has never required mechanical ventilation for exacerbations in the past.  She admits to a dry cough, no fevers or chills, some chest tightness without chest pain.  Denies any nausea, vomiting, diarrhea or abdominal pain.  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  ED Course: Labs revealed potassium 2.9, replaced in the ED.  She was given IV Solu-Medrol, IV magnesium, continuous nebulizer treatment.  Review of Systems: As per HPI otherwise 10 point review of systems negative.   Past Medical History:  Diagnosis Date  . Asthma   . Bronchitis   . COPD (chronic obstructive pulmonary disease) (HCC)   . Grave's disease   . Hypothyroidism   . Shortness of breath 10/22/11   "all the time right now"    Past Surgical History:  Procedure Laterality Date  . ABDOMINAL HYSTERECTOMY  11/2007  . ANKLE SURGERY Left   . APPENDECTOMY  11/2007  . CESAREAN SECTION  1986; 1987; 1993; 1997     reports that she has never smoked. She has never used smokeless tobacco. She reports that she does not drink alcohol or use drugs.  No Known Allergies  Family History  Problem Relation Age of Onset  . Sarcoidosis Mother     Prior to Admission medications   Medication Sig Start Date End Date Taking? Authorizing Provider  albuterol (PROVENTIL HFA;VENTOLIN HFA) 108 (90 BASE) MCG/ACT inhaler Inhale 2 puffs into the lungs every 4 (four) hours as needed for wheezing or shortness of breath. 06/14/15  Yes Horton, Mayer Maskerourtney F, MD  fluticasone furoate-vilanterol (BREO ELLIPTA) 100-25 MCG/INH  AEPB Inhale 1 puff into the lungs 2 (two) times daily.   Yes [provider]  guaiFENesin-dextromethorphan (ROBITUSSIN DM) 100-10 MG/5ML syrup Take 5 mLs by mouth 3 (three) times daily as needed for cough. 03/09/17  Yes Benjiman CorePickering, Nathan, MD  levothyroxine (SYNTHROID, LEVOTHROID) 175 MCG tablet Take 175 mcg by mouth daily.    Yes [provider]  montelukast (SINGULAIR) 10 MG tablet Take 10 mg by mouth at bedtime.   Yes [provider]  albuterol (PROVENTIL) (5 MG/ML) 0.5% nebulizer solution Take 0.5 mLs (2.5 mg total) by nebulization every 4 (four) hours as needed for wheezing. 10/25/11 06/15/16  Kela MillinViyuoh, Adeline C, MD    Physical Exam: Vitals:   04/03/18 0143 04/03/18 0311 04/03/18 0329 04/03/18 0731  BP: 115/68  129/67   Pulse: (!) 103  100   Resp: 20  18   Temp: 97.8 F (36.6 C)  97.9 F (36.6 C)   TempSrc:   Oral   SpO2: 96% 96% 97% 94%  Weight:      Height:        Constitutional: NAD, calm, comfortable Eyes: PERRL, lids and conjunctivae normal ENMT: Mucous membranes are moist. Posterior pharynx clear of any exudate or lesions.Normal dentition.  Neck: normal, supple, no masses, no thyromegaly Respiratory: Wheezes diffusely. Normal respiratory effort. No accessory muscle use.  No conversational dyspnea Cardiovascular: Tachycardic rate and regular rhythm, no murmurs / rubs / gallops. No extremity edema.  Abdomen: no tenderness, no masses palpated. No hepatosplenomegaly.  Bowel sounds positive.  Musculoskeletal: no clubbing / cyanosis. No joint deformity upper and lower extremities. Good ROM, no contractures. Normal muscle tone.  Skin: no rashes, lesions, ulcers. No induration Neurologic: CN 2-12 grossly intact. Strength 5/5 in all 4.  Psychiatric: Normal judgment and insight. Alert and oriented x 3. Normal mood.   Labs on Admission: I have personally reviewed following labs and imaging studies  CBC: Recent Labs  Lab 04/03/18 0133  WBC 6.2  NEUTROABS  2.8  HGB 10.0*  HCT 29.0*  MCV 97.0  PLT 350   Basic Metabolic Panel: Recent Labs  Lab 04/03/18 0155  NA 140  K 2.9*  CL 106  CO2 22  GLUCOSE 150*  BUN 13  CREATININE 0.69  CALCIUM 8.9   GFR: Estimated Creatinine Clearance: 93.1 mL/min (by C-G formula based on SCr of 0.69 mg/dL). Liver Function Tests: No results for input(s): AST, ALT, ALKPHOS, BILITOT, PROT, ALBUMIN in the last 168 hours. No results for input(s): LIPASE, AMYLASE in the last 168 hours. No results for input(s): AMMONIA in the last 168 hours. Coagulation Profile: No results for input(s): INR, PROTIME in the last 168 hours. Cardiac Enzymes: No results for input(s): CKTOTAL, CKMB, CKMBINDEX, TROPONINI in the last 168 hours. BNP (last 3 results) No results for input(s): PROBNP in the last 8760 hours. HbA1C: No results for input(s): HGBA1C in the last 72 hours. CBG: No results for input(s): GLUCAP in the last 168 hours. Lipid Profile: No results for input(s): CHOL, HDL, LDLCALC, TRIG, CHOLHDL, LDLDIRECT in the last 72 hours. Thyroid Function Tests: No results for input(s): TSH, T4TOTAL, FREET4, T3FREE, THYROIDAB in the last 72 hours. Anemia Panel: No results for input(s): VITAMINB12, FOLATE, FERRITIN, TIBC, IRON, RETICCTPCT in the last 72 hours. Urine analysis:    Component Value Date/Time   COLORURINE YELLOW 06/04/2016 1820   APPEARANCEUR CLEAR 06/04/2016 1820   LABSPEC 1.011 06/04/2016 1820   PHURINE 6.0 06/04/2016 1820   GLUCOSEU NEGATIVE 06/04/2016 1820   HGBUR NEGATIVE 06/04/2016 1820   BILIRUBINUR NEGATIVE 06/04/2016 1820   KETONESUR NEGATIVE 06/04/2016 1820   PROTEINUR NEGATIVE 06/04/2016 1820   UROBILINOGEN 1.0 10/24/2011 1154   NITRITE NEGATIVE 06/04/2016 1820   LEUKOCYTESUR NEGATIVE 06/04/2016 1820   Sepsis Labs: !!!!!!!!!!!!!!!!!!!!!!!!!!!!!!!!!!!!!!!!!!!! @LABRCNTIP (procalcitonin:4,lacticidven:4) )No results found for this or any previous visit (from the past 240 hour(s)).    Radiological Exams on Admission: Dg Chest Port 1 View  Result Date: 04/03/2018 CLINICAL DATA:  Wheezing and short of breath EXAM: PORTABLE CHEST 1 VIEW COMPARISON:  03/09/2017 FINDINGS: No acute consolidation or pleural effusion. Mild bronchitic changes. Normal heart size. No pneumothorax. IMPRESSION: No active disease.  Mild bronchitic changes Electronically Signed   By: Jasmine Pang M.D.   On: 04/03/2018 02:14    Assessment/Plan Principal Problem:   COPD exacerbation (HCC) Active Problems:   Thyroid disease   Hypothyroidism   Hypokalemia   COPD exacerbation  -Continue nebs, solumedrol, doxycycline  Hypokalemia -Replace, trend   Hypothyroidism -Continue synthroid    DVT prophylaxis: Lovenox Code Status: Full  Family Communication: No family at bedside Disposition Plan: Pending improvement  Consults called: None  Admission status: Observation   Severity of Illness: The appropriate patient status for this patient is OBSERVATION. Observation status is judged to be reasonable and necessary in order to provide the required intensity of service to ensure the patient's safety. The patient's presenting symptoms, physical exam findings, and initial radiographic and laboratory data in the context of their medical condition is felt to place  them at decreased risk for further clinical deterioration. Furthermore, it is anticipated that the patient will be medically stable for discharge from the hospital within 2 midnights of admission.     Noralee Stain, DO [facility] [email or web address] Password [facility] 04/03/2018, 8:07 AM

## 2018-04-03 NOTE — ED Notes (Signed)
Pt transferred to [place] via [facility]. 

## 2018-04-04 DIAGNOSIS — Z79890 Hormone replacement therapy (postmenopausal): Secondary | ICD-10-CM | POA: Diagnosis not present

## 2018-04-04 DIAGNOSIS — J441 Chronic obstructive pulmonary disease with (acute) exacerbation: Secondary | ICD-10-CM | POA: Diagnosis present

## 2018-04-04 DIAGNOSIS — E039 Hypothyroidism, unspecified: Secondary | ICD-10-CM | POA: Diagnosis present

## 2018-04-04 DIAGNOSIS — Z7951 Long term (current) use of inhaled steroids: Secondary | ICD-10-CM | POA: Diagnosis not present

## 2018-04-04 DIAGNOSIS — Z90710 Acquired absence of both cervix and uterus: Secondary | ICD-10-CM | POA: Diagnosis not present

## 2018-04-04 DIAGNOSIS — E876 Hypokalemia: Secondary | ICD-10-CM | POA: Diagnosis present

## 2018-04-04 DIAGNOSIS — D72829 Elevated white blood cell count, unspecified: Secondary | ICD-10-CM | POA: Diagnosis present

## 2018-04-04 DIAGNOSIS — Z8489 Family history of other specified conditions: Secondary | ICD-10-CM | POA: Diagnosis not present

## 2018-04-04 DIAGNOSIS — E079 Disorder of thyroid, unspecified: Secondary | ICD-10-CM | POA: Diagnosis not present

## 2018-04-04 DIAGNOSIS — K5909 Other constipation: Secondary | ICD-10-CM | POA: Diagnosis present

## 2018-04-04 LAB — CBC
HEMATOCRIT: 32.8 % — AB (ref 36.0–46.0)
HEMATOCRIT: 35.3 % — AB (ref 36.0–46.0)
HEMOGLOBIN: 11.6 g/dL — AB (ref 12.0–15.0)
Hemoglobin: 10.9 g/dL — ABNORMAL LOW (ref 12.0–15.0)
MCH: 32.9 pg (ref 26.0–34.0)
MCH: 32.8 pg (ref 26.0–34.0)
MCHC: 33.2 g/dL (ref 30.0–36.0)
MCHC: 32.9 g/dL (ref 30.0–36.0)
MCV: 99.1 fL (ref 78.0–100.0)
MCV: 99.7 fL (ref 78.0–100.0)
Platelets: 348 10*3/uL (ref 150–400)
Platelets: 386 10*3/uL (ref 150–400)
RBC: 3.31 MIL/uL — ABNORMAL LOW (ref 3.87–5.11)
RBC: 3.54 MIL/uL — AB (ref 3.87–5.11)
RDW: 14.4 % (ref 11.5–15.5)
RDW: 14.3 % (ref 11.5–15.5)
WBC: 26.5 10*3/uL — ABNORMAL HIGH (ref 4.0–10.5)
WBC: 27.1 10*3/uL — ABNORMAL HIGH (ref 4.0–10.5)

## 2018-04-04 LAB — HIV ANTIBODY (ROUTINE TESTING W REFLEX): HIV SCREEN 4TH GENERATION: NONREACTIVE

## 2018-04-04 LAB — BASIC METABOLIC PANEL
Anion gap: 7 (ref 5–15)
BUN: 15 mg/dL (ref 6–20)
CHLORIDE: 108 mmol/L (ref 101–111)
CO2: 23 mmol/L (ref 22–32)
Calcium: 9.9 mg/dL (ref 8.9–10.3)
Creatinine, Ser: 0.66 mg/dL (ref 0.44–1.00)
GFR calc non Af Amer: >60 mL/min (ref >60)
Glucose, Bld: 127 mg/dL — ABNORMAL HIGH (ref 65–99)
Potassium: 4.8 mmol/L (ref 3.5–5.1)
Sodium: 138 mmol/L (ref 135–145)

## 2018-04-04 MED ORDER — IPRATROPIUM-ALBUTEROL 0.5-2.5 (3) MG/3ML IN SOLN
3.0000 mL | Freq: Three times a day (TID) | RESPIRATORY_TRACT | Status: DC
  Administered 2018-04-04 – 2018-04-05 (×3): 3 mL via RESPIRATORY_TRACT
  Filled 2018-04-04 (×4): qty 3

## 2018-04-04 MED ORDER — FLUTICASONE FUROATE-VILANTEROL 100-25 MCG/INH IN AEPB
1.0000 | INHALATION_SPRAY | Freq: Every day | RESPIRATORY_TRACT | Status: DC
  Administered 2018-04-05 – 2018-04-07 (×3): 1 via RESPIRATORY_TRACT

## 2018-04-04 MED ORDER — SENNA 8.6 MG PO TABS
2.0000 | ORAL_TABLET | Freq: Every day | ORAL | Status: DC
  Administered 2018-04-04 – 2018-04-07 (×4): 17.2 mg via ORAL
  Filled 2018-04-04 (×4): qty 2

## 2018-04-04 NOTE — Progress Notes (Signed)
PROGRESS NOTE    Maria Everett  [id number] DOB: 1966-04-29 DOA: 04/02/2018 PCP: Darryl Lentaylor, Amanda, PA-C Brief Narrative:52 y.o. female with medical history significant of COPD, hypothyroidism who presents with shortness of breath worsening since Tuesday.  She states that her last COPD exacerbation was about 2 years ago.  She has never required mechanical ventilation for exacerbations in the past.  She admits to a dry cough, no fevers or chills, some chest tightness without chest pain.  Denies any nausea, vomiting, diarrhea or abdominal pain.  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  ED Course: Labs revealed potassium 2.9, replaced in the ED.  She was given IV Solu-Medrol, IV magnesium, continuous nebulizer treatment.     Assessment & Plan:   Principal Problem:   COPD exacerbation (HCC) Active Problems:   Thyroid disease   Hypothyroidism   Hypokalemia  1]copd exacerbation-continue nebs iv steroids doxy.she feels better.non smoker.  2]chronic constipation-senna  3]hypothyroidsm-continue synthroid.  4]leukocytosis due to steroids.  5]hypokalemia resolved.    DVT prophylaxis:lovenox Code Status full Family Communication:none Disposition Plan:tbd Consultants: none  Procedures: none Antimicrobials: doxy Subjective: Breathing better c/o constipation  Objective:resting in bed in nad Vitals:   04/03/18 1921 04/03/18 2217 04/04/18 0552 04/04/18 0739  BP:  (!) 143/87 121/68   Pulse:  96 74   Resp:  15 14   Temp:  97.9 F (36.6 C) 98 F (36.7 C)   TempSrc:  Oral Oral   SpO2: 96% 98% 98% 98%  Weight:      Height:        Intake/Output Summary (Last 24 hours) at 04/04/2018 0916 Last data filed at 04/04/2018 0900 Gross per 24 hour  Intake 1550 ml  Output 1400 ml  Net 150 ml   Filed Weights   04/02/18 2323  Weight: 89.8 kg (198 lb)    Examination:  General exam: Appears calm and comfortable  Respiratory system:  wheezing both lung fields auscultation. Respiratory effort normal. Cardiovascular system: S1 & S2 heard, RRR. No JVD, murmurs, rubs, gallops or clicks. No pedal edema. Gastrointestinal system: Abdomen is nondistended, soft and nontender. No organomegaly or masses felt. Normal bowel sounds heard. Central nervous system: Alert and oriented. No focal neurological deficits. Extremities: Symmetric 5 x 5 power. Skin: No rashes, lesions or ulcers Psychiatry: Judgement and insight appear normal. Mood & affect appropriate.     Data Reviewed: I have personally reviewed following labs and imaging studies  CBC: Recent Labs  Lab 04/03/18 0133 04/04/18 0507 04/04/18 0722  WBC 6.2 26.5* 27.1*  NEUTROABS 2.8  --   --   HGB 10.0* 10.9* 11.6*  HCT 29.0* 32.8* 35.3*  MCV 97.0 99.1 99.7  PLT 350 348 386   Basic Metabolic Panel: Recent Labs  Lab 04/03/18 0155 04/04/18 0507  NA 140 138  K 2.9* 4.8  CL 106 108  CO2 22 23  GLUCOSE 150* 127*  BUN 13 15  CREATININE 0.69 0.66  CALCIUM 8.9 9.9   GFR: Estimated Creatinine Clearance: 93.1 mL/min (by C-G formula based on SCr of 0.66 mg/dL). Liver Function Tests: No results for input(s): AST, ALT, ALKPHOS, BILITOT, PROT, ALBUMIN in the last 168 hours. No results for input(s): LIPASE, AMYLASE in the last 168 hours. No results for input(s): AMMONIA in the last 168 hours. Coagulation Profile: No results for input(s): INR, PROTIME in the last 168 hours. Cardiac Enzymes: No results for input(s): CKTOTAL, CKMB, CKMBINDEX, TROPONINI in the last 168 hours. BNP (  last 3 results) No results for input(s): PROBNP in the last 8760 hours. HbA1C: No results for input(s): HGBA1C in the last 72 hours. CBG: No results for input(s): GLUCAP in the last 168 hours. Lipid Profile: No results for input(s): CHOL, HDL, LDLCALC, TRIG, CHOLHDL, LDLDIRECT in the last 72 hours. Thyroid Function Tests: No results for input(s): TSH, T4TOTAL, FREET4, T3FREE, THYROIDAB in  the last 72 hours. Anemia Panel: No results for input(s): VITAMINB12, FOLATE, FERRITIN, TIBC, IRON, RETICCTPCT in the last 72 hours. Sepsis Labs: No results for input(s): PROCALCITON, LATICACIDVEN in the last 168 hours.  No results found for this or any previous visit (from the past 240 hour(s)).       Radiology Studies: Dg Chest Port 1 View  Result Date: 04/03/2018 CLINICAL DATA:  Wheezing and short of breath EXAM: PORTABLE CHEST 1 VIEW COMPARISON:  03/09/2017 FINDINGS: No acute consolidation or pleural effusion. Mild bronchitic changes. Normal heart size. No pneumothorax. IMPRESSION: No active disease.  Mild bronchitic changes Electronically Signed   By: Jasmine Pang M.D.   On: 04/03/2018 02:14        Scheduled Meds: . doxycycline  100 mg Oral Q12H  . enoxaparin (LOVENOX) injection  40 mg Subcutaneous Q24H  . fluticasone furoate-vilanterol  1 puff Inhalation BID  . ipratropium-albuterol  3 mL Nebulization TID  . levothyroxine  175 mcg Oral QAC breakfast  . methylPREDNISolone (SOLU-MEDROL) injection  60 mg Intravenous TID  . montelukast  10 mg Oral QHS  . senna  2 tablet Oral Daily  . sodium chloride flush  3 mL Intravenous Q12H   Continuous Infusions: . sodium chloride       LOS: 0 days      Alwyn Ren, MD [facility] Pager [phone]  If 7PM-7AM, please contact night-coverage [email or web address] Password [facility] 04/04/2018, 9:16 AM

## 2018-04-05 MED ORDER — IPRATROPIUM-ALBUTEROL 0.5-2.5 (3) MG/3ML IN SOLN
3.0000 mL | Freq: Two times a day (BID) | RESPIRATORY_TRACT | Status: DC
  Administered 2018-04-05 – 2018-04-07 (×4): 3 mL via RESPIRATORY_TRACT
  Filled 2018-04-05 (×4): qty 3

## 2018-04-05 MED ORDER — FLUTICASONE PROPIONATE 50 MCG/ACT NA SUSP
1.0000 | Freq: Every day | NASAL | Status: DC
  Administered 2018-04-05 – 2018-04-06 (×2): 1 via NASAL
  Filled 2018-04-05: qty 16

## 2018-04-05 MED ORDER — LACTULOSE 10 GM/15ML PO SOLN
20.0000 g | Freq: Once | ORAL | Status: AC
  Administered 2018-04-05: 20 g via ORAL
  Filled 2018-04-05: qty 30

## 2018-04-05 MED ORDER — METHYLPREDNISOLONE SODIUM SUCC 125 MG IJ SOLR
60.0000 mg | Freq: Two times a day (BID) | INTRAMUSCULAR | Status: DC
  Administered 2018-04-06: 60 mg via INTRAVENOUS
  Filled 2018-04-05: qty 2

## 2018-04-05 MED ORDER — BISACODYL 5 MG PO TBEC
10.0000 mg | DELAYED_RELEASE_TABLET | Freq: Once | ORAL | Status: AC
  Administered 2018-04-05: 10 mg via ORAL
  Filled 2018-04-05: qty 2

## 2018-04-05 NOTE — Plan of Care (Signed)
  Problem: Respiratory: Goal: Ability to maintain a clear airway will improve Outcome: Progressing Goal: Levels of oxygenation will improve Outcome: Progressing   

## 2018-04-05 NOTE — Progress Notes (Signed)
PROGRESS NOTE    Maria LawmanBernita Everett  [id number] DOB: [birth year] DOA: 04/02/2018 PCP: Maria Everett, Amanda, PA-C 52 y.o.femalewith medical history significant ofCOPD, hypothyroidism who presents with shortness of breath worsening since Tuesday. She states that her last COPD exacerbation was about 2 years ago. She has never required mechanical ventilation for exacerbations in the past. She admits to a dry cough, no fevers or chills, some chest tightness without chest pain. Denies any nausea, vomiting, diarrhea or abdominal pain. 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  ED Course:Labs revealed potassium 2.9,replaced in the ED. She was given IV Solu-Medrol, IV magnesium, continuous nebulizer treatment.   Assessment & Plan:   Principal Problem:   COPD exacerbation (HCC) Active Problems:   Thyroid disease   Hypothyroidism   Hypokalemia    1]copd exacerbation-continue nebs iv steroids doxy.she feels better.non smoker.  Taper down steroids.  2]chronic constipation-senna, lactulose, Dulcolax  3]hypothyroidsm-continue synthroid.  4]leukocytosis due to steroids.  Follow-up labs tomorrow  5]hypokalemia resolved.  Follow-up labs tomorrow.      DVT prophylaxis: Lovenox Code Status: Full code Family Communication: None Disposition Plan plan discharge 24 to 48 hours. Consultants none:   Procedures: None antimicrobials doxycycline feels better than yesterday though still wheezing. Subjective:   Objective: Vitals:   04/04/18 2207 04/05/18 0532 04/05/18 1233 04/05/18 1446  BP: 117/70 (!) 114/54  112/67  Pulse: 79 (!) 58 (!) 59 71  Resp: 15 16 16 18   Temp: 97.8 F (36.6 C) 97.9 F (36.6 C)  97.6 F (36.4 C)  TempSrc: Oral Oral  Oral  SpO2: 98% 100% 96% 100%  Weight:      Height:        Intake/Output Summary (Last 24 hours) at 04/05/2018 1450 Last data filed at 04/05/2018 1000 Gross per 24 hour  Intake 1140 ml  Output  2000 ml  Net -860 ml   Filed Weights   04/02/18 2323  Weight: 89.8 kg (198 lb)    Examination:  General exam: Appears calm and comfortable  Respiratory system: Wheezing decreased  auscultation. Respiratory effort normal. Cardiovascular system: S1 & S2 heard, RRR. No JVD, murmurs, rubs, gallops or clicks. No pedal edema. Gastrointestinal system: Abdomen is nondistended, soft and nontender. No organomegaly or masses felt. Normal bowel sounds heard. Central nervous system: Alert and oriented. No focal neurological deficits. Extremities: Symmetric 5 x 5 power. Skin: No rashes, lesions or ulcers Psychiatry: Judgement and insight appear normal. Mood & affect appropriate.     Data Reviewed: I have personally reviewed following labs and imaging studies  CBC: Recent Labs  Lab 04/03/18 0133 04/04/18 0507 04/04/18 0722  WBC 6.2 26.5* 27.1*  NEUTROABS 2.8  --   --   HGB 10.0* 10.9* 11.6*  HCT 29.0* 32.8* 35.3*  MCV 97.0 99.1 99.7  PLT 350 348 386   Basic Metabolic Panel: Recent Labs  Lab 04/03/18 0155 04/04/18 0507  NA 140 138  K 2.9* 4.8  CL 106 108  CO2 22 23  GLUCOSE 150* 127*  BUN 13 15  CREATININE 0.69 0.66  CALCIUM 8.9 9.9   GFR: Estimated Creatinine Clearance: 93.1 mL/min (by C-G formula based on SCr of 0.66 mg/dL). Liver Function Tests: No results for input(s): AST, ALT, ALKPHOS, BILITOT, PROT, ALBUMIN in the last 168 hours. No results for input(s): LIPASE, AMYLASE in the last 168 hours. No results for input(s): AMMONIA in the last 168 hours. Coagulation Profile: No results for input(s): INR, PROTIME in the last  168 hours. Cardiac Enzymes: No results for input(s): CKTOTAL, CKMB, CKMBINDEX, TROPONINI in the last 168 hours. BNP (last 3 results) No results for input(s): PROBNP in the last 8760 hours. HbA1C: No results for input(s): HGBA1C in the last 72 hours. CBG: No results for input(s): GLUCAP in the last 168 hours. Lipid Profile: No results for  input(s): CHOL, HDL, LDLCALC, TRIG, CHOLHDL, LDLDIRECT in the last 72 hours. Thyroid Function Tests: No results for input(s): TSH, T4TOTAL, FREET4, T3FREE, THYROIDAB in the last 72 hours. Anemia Panel: No results for input(s): VITAMINB12, FOLATE, FERRITIN, TIBC, IRON, RETICCTPCT in the last 72 hours. Sepsis Labs: No results for input(s): PROCALCITON, LATICACIDVEN in the last 168 hours.  No results found for this or any previous visit (from the past 240 hour(s)).       Radiology Studies: No results found.      Scheduled Meds: . bisacodyl  10 mg Oral Once  . doxycycline  100 mg Oral Q12H  . enoxaparin (LOVENOX) injection  40 mg Subcutaneous Q24H  . fluticasone furoate-vilanterol  1 puff Inhalation Daily  . ipratropium-albuterol  3 mL Nebulization BID  . lactulose  20 g Oral Once  . levothyroxine  175 mcg Oral QAC breakfast  . methylPREDNISolone (SOLU-MEDROL) injection  60 mg Intravenous TID  . montelukast  10 mg Oral QHS  . senna  2 tablet Oral Daily  . sodium chloride flush  3 mL Intravenous Q12H   Continuous Infusions: . sodium chloride       LOS: 1 day     Alwyn Ren, MD [facility] If 7PM-7AM, please contact night-coverage [email or web address] Password [email or web address] 04/05/2018, 2:50 PM

## 2018-04-06 LAB — BASIC METABOLIC PANEL
Anion gap: 5 (ref 5–15)
BUN: 21 mg/dL — ABNORMAL HIGH (ref 6–20)
CHLORIDE: 108 mmol/L (ref 98–111)
CO2: 26 mmol/L (ref 22–32)
Calcium: 9.1 mg/dL (ref 8.9–10.3)
Creatinine, Ser: 0.69 mg/dL (ref 0.44–1.00)
GFR calc Af Amer: >60 mL/min (ref >60)
Glucose, Bld: 94 mg/dL (ref 70–99)
POTASSIUM: 4.5 mmol/L (ref 3.5–5.1)
SODIUM: 139 mmol/L (ref 135–145)

## 2018-04-06 LAB — CBC
HCT: 34 % — ABNORMAL LOW (ref 36.0–46.0)
HEMOGLOBIN: 10.9 g/dL — AB (ref 12.0–15.0)
MCH: 32.2 pg (ref 26.0–34.0)
MCHC: 32.1 g/dL (ref 30.0–36.0)
MCV: 100.3 fL — ABNORMAL HIGH (ref 78.0–100.0)
PLATELETS: 306 10*3/uL (ref 150–400)
RBC: 3.39 MIL/uL — ABNORMAL LOW (ref 3.87–5.11)
RDW: 14.3 % (ref 11.5–15.5)
WBC: 10.3 10*3/uL (ref 4.0–10.5)

## 2018-04-06 MED ORDER — METHYLPREDNISOLONE SODIUM SUCC 125 MG IJ SOLR: 60.0000 mg | Freq: Every day | INTRAMUSCULAR | Status: DC

## 2018-04-06 NOTE — Care Management Note (Signed)
Case Management Note  Patient Details  Name: Maria LawmanBernita Powell MRN: [id number] Date of Birth: 09-23-1966  Subjective/Objective: Pt admitted with COPD              Action/Plan: Pt states she will not need HH at discharge   Expected Discharge Date:  04/05/18               Expected Discharge Plan:  Home/Self Care  In-House Referral:     Discharge planning Services  CM Consult  Post Acute Care Choice:    Choice offered to:     DME Arranged:    DME Agency:     HH Arranged:    HH Agency:     Status of Service:  Completed, signed off  If discussed at [employer] Length of Stay Meetings, dates discussed:    Additional CommentsGeni Bers:  Christos Mixson, RN 04/06/2018, 11:06 AM

## 2018-04-06 NOTE — Progress Notes (Signed)
PROGRESS NOTE    Maria Everett  ZOX:[id number] DOB: 10-Aug-1966 DOA: 04/02/2018 PCP: Darryl Lent, PA-C  Brief Narrative:52 y.o.femalewith medical history significant ofCOPD, hypothyroidism who presents with shortness of breath worsening since Tuesday. She states that her last COPD exacerbation was about 2 years ago. She has never required mechanical ventilation for exacerbations in the past. She admits to a dry cough, no fevers or chills, some chest tightness without chest pain. Denies any nausea, vomiting, diarrhea or abdominal pain. 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  ED Course:Labs revealed potassium 2.9,replaced in the ED. She was given IV Solu-Medrol, IV magnesium, continuous nebulizer treatment.     Assessment & Plan:   Principal Problem:   COPD exacerbation (HCC) Active Problems:   Thyroid disease   Hypothyroidism   Hypokalemia   1]copd exacerbation-continue nebs iv steroids doxy.she feels better.non smoker.  Taper down steroids to once a day.  Patient feels not ready to go home but that she should be ready by tomorrow to be discharged home on p.o. steroids.  She agrees with the plan.  2]chronic constipation-resolved  3]hypothyroidsm-continue synthroid.  4]leukocytosis resolved  5]hypokalemia resolved.    DVT prophylaxis Lovenox :Code Status: Full code Family Communication no family available Disposition Plan plan discharge 04/07/2018 Consultants: None  Procedures: None Antimicrobials: Doxycycline feels better but still wheezing Subjective:   Objective: Resting in bed in no acute distress Vitals:   04/05/18 1916 04/05/18 2138 04/06/18 0531 04/06/18 0613  BP:  116/68 113/63   Pulse:  72 (!) 59   Resp:  17 16   Temp:  98 F (36.7 C) 98 F (36.7 C)   TempSrc:  Oral Oral   SpO2: 97% 99% 98% 97%  Weight:      Height:        Intake/Output Summary (Last 24 hours) at 04/06/2018 1039 Last data  filed at 04/06/2018 0600 Gross per 24 hour  Intake 760 ml  Output 501 ml  Net 259 ml   Filed Weights   04/02/18 2323  Weight: 89.8 kg (198 lb)    Examination:  General exam: Appears calm and comfortable  Respiratory system: DECREASED WHEEZING  auscultation. Respiratory effort normal. Cardiovascular system: S1 & S2 heard, RRR. No JVD, murmurs, rubs, gallops or clicks. No pedal edema. Gastrointestinal system: Abdomen is nondistended, soft and nontender. No organomegaly or masses felt. Normal bowel sounds heard. Central nervous system: Alert and oriented. No focal neurological deficits. Extremities: Symmetric 5 x 5 power. Skin: No rashes, lesions or ulcers Psychiatry: Judgement and insight appear normal. Mood & affect appropriate.     Data Reviewed: I have personally reviewed following labs and imaging studies  CBC: Recent Labs  Lab 04/03/18 0133 04/04/18 0507 04/04/18 0722 04/06/18 0536  WBC 6.2 26.5* 27.1* 10.3  NEUTROABS 2.8  --   --   --   HGB 10.0* 10.9* 11.6* 10.9*  HCT 29.0* 32.8* 35.3* 34.0*  MCV 97.0 99.1 99.7 100.3*  PLT 350 348 386 306   Basic Metabolic Panel: Recent Labs  Lab 04/03/18 0155 04/04/18 0507 04/06/18 0536  NA 140 138 139  K 2.9* 4.8 4.5  CL 106 108 108  CO2 22 23 26   GLUCOSE 150* 127* 94  BUN 13 15 21*  CREATININE 0.69 0.66 0.69  CALCIUM 8.9 9.9 9.1   GFR: Estimated Creatinine Clearance: 93.1 mL/min (by C-G formula based on SCr of 0.69 mg/dL). Liver Function Tests: No results for input(s): AST, ALT, ALKPHOS,  BILITOT, PROT, ALBUMIN in the last 168 hours. No results for input(s): LIPASE, AMYLASE in the last 168 hours. No results for input(s): AMMONIA in the last 168 hours. Coagulation Profile: No results for input(s): INR, PROTIME in the last 168 hours. Cardiac Enzymes: No results for input(s): CKTOTAL, CKMB, CKMBINDEX, TROPONINI in the last 168 hours. BNP (last 3 results) No results for input(s): PROBNP in the last 8760  hours. HbA1C: No results for input(s): HGBA1C in the last 72 hours. CBG: No results for input(s): GLUCAP in the last 168 hours. Lipid Profile: No results for input(s): CHOL, HDL, LDLCALC, TRIG, CHOLHDL, LDLDIRECT in the last 72 hours. Thyroid Function Tests: No results for input(s): TSH, T4TOTAL, FREET4, T3FREE, THYROIDAB in the last 72 hours. Anemia Panel: No results for input(s): VITAMINB12, FOLATE, FERRITIN, TIBC, IRON, RETICCTPCT in the last 72 hours. Sepsis Labs: No results for input(s): PROCALCITON, LATICACIDVEN in the last 168 hours.  No results found for this or any previous visit (from the past 240 hour(s)).       Radiology Studies: No results found.      Scheduled Meds: . doxycycline  100 mg Oral Q12H  . enoxaparin (LOVENOX) injection  40 mg Subcutaneous Q24H  . fluticasone  1 spray Each Nare Daily  . fluticasone furoate-vilanterol  1 puff Inhalation Daily  . ipratropium-albuterol  3 mL Nebulization BID  . levothyroxine  175 mcg Oral QAC breakfast  . [START ON 04/07/2018] methylPREDNISolone (SOLU-MEDROL) injection  60 mg Intravenous Daily  . montelukast  10 mg Oral QHS  . senna  2 tablet Oral Daily  . sodium chloride flush  3 mL Intravenous Q12H   Continuous Infusions: . sodium chloride       LOS: 2 days     Alwyn RenElizabeth G Mathews, MD [facility]  If 7PM-7AM, please contact night-coverage [email or web address] Password [email or web address] 04/06/2018, 10:39 AM

## 2018-04-06 NOTE — Plan of Care (Signed)
  Problem: Activity: Goal: Ability to tolerate increased activity will improve Outcome: Progressing   Problem: Respiratory: Goal: Ability to maintain a clear airway will improve Outcome: Progressing   Problem: Activity: Goal: Risk for activity intolerance will decrease Outcome: Completed/Met   Problem: Elimination: Goal: Will not experience complications related to bowel motility Outcome: Completed/Met   Problem: Respiratory: Goal: Levels of oxygenation will improve Outcome: Completed/Met

## 2018-04-07 DIAGNOSIS — E079 Disorder of thyroid, unspecified: Secondary | ICD-10-CM

## 2018-04-07 DIAGNOSIS — E876 Hypokalemia: Secondary | ICD-10-CM

## 2018-04-07 DIAGNOSIS — E039 Hypothyroidism, unspecified: Secondary | ICD-10-CM

## 2018-04-07 MED ORDER — DOXYCYCLINE HYCLATE 100 MG PO TABS: 100.0000 mg | ORAL_TABLET | Freq: Every evening | ORAL | 0 refills | Status: AC

## 2018-04-07 MED ORDER — FLUTICASONE PROPIONATE 50 MCG/ACT NA SUSP: 1.0000 | Freq: Every day | NASAL | 0 refills | Status: AC

## 2018-04-07 MED ORDER — FLUTICASONE FUROATE-VILANTEROL 100-25 MCG/INH IN AEPB: 1.0000 | INHALATION_SPRAY | Freq: Every day | RESPIRATORY_TRACT | Status: DC

## 2018-04-07 MED ORDER — PREDNISONE 20 MG PO TABS
40.0000 mg | ORAL_TABLET | Freq: Every day | ORAL | Status: DC
  Administered 2018-04-07: 40 mg via ORAL
  Filled 2018-04-07: qty 2

## 2018-04-07 MED ORDER — PREDNISONE 20 MG PO TABS: ORAL_TABLET | ORAL | 0 refills | Status: AC

## 2018-04-07 NOTE — Discharge Summary (Signed)
Physician Discharge Summary  Maria Everett WUJ:[id number] DOB: 05-Sep-1966 DOA: 04/02/2018  PCP: Maria Lent, PA-C  Admit date: 04/02/2018 Discharge date: 04/07/2018  Admitted From: Home Disposition: Home  Recommendations for Outpatient Follow-up:  1. Follow up with PCP in 1 week 2. Follow up with pulmonology 3. Please follow up on the following pending results: None  Home Health: None Equipment/Devices: None  Discharge Condition: Stable CODE STATUS: Full code Diet recommendation: Regular   Brief/Interim Summary:  Admission HPI written by Noralee Stain, DO   Chief Complaint: SOB  HPI: Maria Everett is a 52 y.o. female with medical history significant of COPD, hypothyroidism who presents with shortness of breath worsening since Tuesday.  She states that her last COPD exacerbation was about 2 years ago.  She has never required mechanical ventilation for exacerbations in the past.  She admits to a dry cough, no fevers or chills, some chest tightness without chest pain.  Denies any nausea, vomiting, diarrhea or abdominal pain.  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  ED Course: Labs revealed potassium 2.9, replaced in the ED.  She was given IV Solu-Medrol, IV magnesium, continuous nebulizer treatment.    Hospital course:  COPD exacerbation Patient treated with IV steroids, doxycycline and Duoneb/albuterol. She improved and was discharged on a steroid taper in addition to 1 more dose of doxycycline (5 day total course). Patient may benefit from pulmonology follow-up in the [place] area.  Chronic constipation Given Senokot. Resolved.  Hypothyroidism Continued synthroid  Leukocytosis Likely in setting of steroids. Resolved.  Hypokalemia Given supplementation. Resolved.   Discharge Diagnoses:  Principal Problem:   COPD exacerbation (HCC) Active Problems:   Thyroid disease   Hypothyroidism    Hypokalemia    Discharge Instructions  Discharge Instructions    Call MD for:  difficulty breathing, headache or visual disturbances   Complete by:  As directed    Diet - low sodium heart healthy   Complete by:  As directed    Increase activity slowly   Complete by:  As directed      Allergies as of 04/07/2018   No Known Allergies     Medication List    TAKE these medications   albuterol 108 (90 Base) MCG/ACT inhaler Commonly known as:  PROVENTIL HFA;VENTOLIN HFA Inhale 2 puffs into the lungs every 4 (four) hours as needed for wheezing or shortness of breath. What changed:  Another medication with the same name was removed. Continue taking this medication, and follow the directions you see here.   doxycycline 100 MG tablet Commonly known as:  VIBRA-TABS Take 1 tablet (100 mg total) by mouth every evening for 1 dose.   fluticasone 50 MCG/ACT nasal spray Commonly known as:  FLONASE Place 1-2 sprays into both nostrils daily.   fluticasone furoate-vilanterol 100-25 MCG/INH Aepb Commonly known as:  BREO ELLIPTA Inhale 1 puff into the lungs daily. What changed:  when to take this   guaiFENesin-dextromethorphan 100-10 MG/5ML syrup Commonly known as:  ROBITUSSIN DM Take 5 mLs by mouth 3 (three) times daily as needed for cough.   levothyroxine 175 MCG tablet Commonly known as:  SYNTHROID, LEVOTHROID Take 175 mcg by mouth daily.   montelukast 10 MG tablet Commonly known as:  SINGULAIR Take 10 mg by mouth at bedtime.   predniSONE 20 MG tablet Commonly known as:  DELTASONE Take 2 tablets (40 mg total) by mouth daily with breakfast for 1 day, THEN 1 tablet (20  mg total) daily with breakfast for 2 days, THEN 0.5 tablets (10 mg total) daily with breakfast for 2 days. Start taking on:  04/08/2018      Follow-up Information    Maria Lent, PA-C. Schedule an appointment as soon as possible for a visit in 1 week(s).   Specialty:  Physician Assistant Contact  information: [address] [place] [address] [phone]        [facility]. Schedule an appointment as soon as possible for a visit.   Specialty:  Pulmonology Contact information: [address] [place] [address] [phone]         No Known Allergies  Consultations:  None   Procedures/Studies: Dg Chest Port 1 View  Result Date: 04/03/2018 CLINICAL DATA:  Wheezing and short of breath EXAM: PORTABLE CHEST 1 VIEW COMPARISON:  03/09/2017 FINDINGS: No acute consolidation or pleural effusion. Mild bronchitic changes. Normal heart size. No pneumothorax. IMPRESSION: No active disease.  Mild bronchitic changes Electronically Signed   By: Jasmine Pang M.D.   On: 04/03/2018 02:14      Subjective: No wheezing. Cough improved. Ambulated without issue.  Discharge Exam: Vitals:   04/07/18 0556 04/07/18 0806  BP: 105/69   Pulse: 66   Resp:    Temp: 98 F (36.7 C)   SpO2: 100% 98%   Vitals:   04/06/18 1941 04/06/18 2121 04/07/18 0556 04/07/18 0806  BP:  110/75 105/69   Pulse:  85 66   Resp:  16    Temp:  98 F (36.7 C) 98 F (36.7 C)   TempSrc:  Oral Oral   SpO2: 98% 100% 100% 98%  Weight:      Height:        General: Pt is alert, awake, not in acute distress Cardiovascular: RRR, S1/S2 +, no rubs, no gallops Respiratory: CTA bilaterally, no wheezing, no rhonchi Abdominal: Soft, NT, ND, bowel sounds + Extremities: no edema, no cyanosis    The results of significant diagnostics from this hospitalization (including imaging, microbiology, ancillary and laboratory) are listed below for reference.     Microbiology: No results found for this or any previous visit (from the past 240 hour(s)).   Labs: BNP (last 3 results) No results for input(s): BNP in the last 8760 hours. Basic Metabolic Panel: Recent Labs  Lab 04/03/18 0155 04/04/18 0507 04/06/18 0536  NA 140 138 139  K 2.9* 4.8 4.5  CL 106 108 108  CO2 22 23  26   GLUCOSE 150* 127* 94  BUN 13 15 21*  CREATININE 0.69 0.66 0.69  CALCIUM 8.9 9.9 9.1   Liver Function Tests: No results for input(s): AST, ALT, ALKPHOS, BILITOT, PROT, ALBUMIN in the last 168 hours. No results for input(s): LIPASE, AMYLASE in the last 168 hours. No results for input(s): AMMONIA in the last 168 hours. CBC: Recent Labs  Lab 04/03/18 0133 04/04/18 0507 04/04/18 0722 04/06/18 0536  WBC 6.2 26.5* 27.1* 10.3  NEUTROABS 2.8  --   --   --   HGB 10.0* 10.9* 11.6* 10.9*  HCT 29.0* 32.8* 35.3* 34.0*  MCV 97.0 99.1 99.7 100.3*  PLT 350 348 386 306   Cardiac Enzymes: No results for input(s): CKTOTAL, CKMB, CKMBINDEX, TROPONINI in the last 168 hours. BNP: Invalid input(s): POCBNP CBG: No results for input(s): GLUCAP in the last 168 hours. D-Dimer No results for input(s): DDIMER in the last 72 hours. Hgb A1c No results for input(s): HGBA1C in the last 72 hours.  Lipid Profile No results for input(s): CHOL, HDL, LDLCALC, TRIG, CHOLHDL, LDLDIRECT in the last 72 hours. Thyroid function studies No results for input(s): TSH, T4TOTAL, T3FREE, THYROIDAB in the last 72 hours.  Invalid input(s): FREET3 Anemia work up No results for input(s): VITAMINB12, FOLATE, FERRITIN, TIBC, IRON, RETICCTPCT in the last 72 hours. Urinalysis    Component Value Date/Time   COLORURINE YELLOW 06/04/2016 1820   APPEARANCEUR CLEAR 06/04/2016 1820   LABSPEC 1.011 06/04/2016 1820   PHURINE 6.0 06/04/2016 1820   GLUCOSEU NEGATIVE 06/04/2016 1820   HGBUR NEGATIVE 06/04/2016 1820   BILIRUBINUR NEGATIVE 06/04/2016 1820   KETONESUR NEGATIVE 06/04/2016 1820   PROTEINUR NEGATIVE 06/04/2016 1820   UROBILINOGEN 1.0 10/24/2011 1154   NITRITE NEGATIVE 06/04/2016 1820   LEUKOCYTESUR NEGATIVE 06/04/2016 1820    SIGNED:   Jacquelin Hawkingalph Alisse Tuite, MD [facility] 04/07/2018, 9:16 AM

## 2018-04-07 NOTE — Discharge Instructions (Signed)
Chronic Obstructive Pulmonary Disease Exacerbation  Chronic obstructive pulmonary disease (COPD) is a common lung problem. In COPD, the flow of air from the lungs is limited. COPD exacerbations are times that breathing gets worse and you need extra treatment. Without treatment they can be life threatening. If they happen often, your lungs can become more damaged. If your COPD gets worse, your doctor may treat you with:  ? Medicines.  ? Oxygen.  ? Different ways to clear your airway, such as using a mask.    Follow these instructions at home:  ? Do not smoke.  ? Avoid tobacco smoke and other things that bother your lungs.  ? If given, take your antibiotic medicine as told. Finish the medicine even if you start to feel better.  ? Only take medicines as told by your doctor.  ? Drink enough fluids to keep your pee (urine) clear or pale yellow (unless your doctor has told you not to).  ? Use a cool mist machine (vaporizer).  ? If you use oxygen or a machine that turns liquid medicine into a mist (nebulizer), continue to use them as told.  ? Keep up with shots (vaccinations) as told by your doctor.  ? Exercise regularly.  ? Eat healthy foods.  ? Keep all doctor visits as told.  Get help right away if:  ? You are very short of breath and it gets worse.  ? You have trouble talking.  ? You have bad chest pain.  ? You have blood in your spit (sputum).  ? You have a fever.  ? You keep throwing up (vomiting).  ? You feel weak, or you pass out (faint).  ? You feel confused.  ? You keep getting worse.  This information is not intended to replace advice given to you by your health care provider. Make sure you discuss any questions you have with your health care provider.  Document Released: 09/18/2011 Document Revised: 03/06/2016 Document Reviewed: 06/03/2013  Elsevier Interactive Patient Education ? 2017 [employer] Inc[employer]

## 2018-04-22 ENCOUNTER — Emergency Department (HOSPITAL_BASED_OUTPATIENT_CLINIC_OR_DEPARTMENT_OTHER): Payer: BLUE CROSS/BLUE SHIELD

## 2018-04-22 ENCOUNTER — Other Ambulatory Visit: Payer: Self-pay

## 2018-04-22 ENCOUNTER — Inpatient Hospital Stay (HOSPITAL_BASED_OUTPATIENT_CLINIC_OR_DEPARTMENT_OTHER)
Admission: EM | Admit: 2018-04-22 | Discharge: 2018-04-25 | DRG: 191 | Disposition: A | Discharge status: Home / Self Care | Payer: BLUE CROSS/BLUE SHIELD | Attending: Internal Medicine | Admitting: Internal Medicine

## 2018-04-22 ENCOUNTER — Encounter (HOSPITAL_BASED_OUTPATIENT_CLINIC_OR_DEPARTMENT_OTHER): Payer: Self-pay | Admitting: Emergency Medicine

## 2018-04-22 DIAGNOSIS — J45909 Unspecified asthma, uncomplicated: Secondary | ICD-10-CM

## 2018-04-22 DIAGNOSIS — E876 Hypokalemia: Secondary | ICD-10-CM | POA: Diagnosis present

## 2018-04-22 DIAGNOSIS — E079 Disorder of thyroid, unspecified: Secondary | ICD-10-CM | POA: Diagnosis present

## 2018-04-22 DIAGNOSIS — T380X5A Adverse effect of glucocorticoids and synthetic analogues, initial encounter: Secondary | ICD-10-CM | POA: Diagnosis present

## 2018-04-22 DIAGNOSIS — Z77098 Contact with and (suspected) exposure to other hazardous, chiefly nonmedicinal, chemicals: Secondary | ICD-10-CM | POA: Diagnosis present

## 2018-04-22 DIAGNOSIS — Z6837 Body mass index (BMI) 37.0-37.9, adult: Secondary | ICD-10-CM

## 2018-04-22 DIAGNOSIS — E0500 Thyrotoxicosis with diffuse goiter without thyrotoxic crisis or storm: Secondary | ICD-10-CM | POA: Diagnosis present

## 2018-04-22 DIAGNOSIS — Z90710 Acquired absence of both cervix and uterus: Secondary | ICD-10-CM

## 2018-04-22 DIAGNOSIS — R0902 Hypoxemia: Secondary | ICD-10-CM | POA: Diagnosis present

## 2018-04-22 DIAGNOSIS — D72829 Elevated white blood cell count, unspecified: Secondary | ICD-10-CM | POA: Diagnosis present

## 2018-04-22 DIAGNOSIS — J441 Chronic obstructive pulmonary disease with (acute) exacerbation: Secondary | ICD-10-CM | POA: Diagnosis not present

## 2018-04-22 DIAGNOSIS — E669 Obesity, unspecified: Secondary | ICD-10-CM | POA: Diagnosis present

## 2018-04-22 DIAGNOSIS — Z7951 Long term (current) use of inhaled steroids: Secondary | ICD-10-CM

## 2018-04-22 DIAGNOSIS — Z79890 Hormone replacement therapy (postmenopausal): Secondary | ICD-10-CM

## 2018-04-22 DIAGNOSIS — Z79899 Other long term (current) drug therapy: Secondary | ICD-10-CM

## 2018-04-22 DIAGNOSIS — R0602 Shortness of breath: Secondary | ICD-10-CM | POA: Diagnosis not present

## 2018-04-22 DIAGNOSIS — J45901 Unspecified asthma with (acute) exacerbation: Secondary | ICD-10-CM | POA: Diagnosis present

## 2018-04-22 LAB — CBC WITH DIFFERENTIAL/PLATELET
Basophils Absolute: 0 10*3/uL (ref 0.0–0.1)
Basophils Relative: 1 %
Eosinophils Absolute: 0.3 10*3/uL (ref 0.0–0.7)
Eosinophils Relative: 8 %
HEMATOCRIT: 28.6 % — AB (ref 36.0–46.0)
HEMOGLOBIN: 9.7 g/dL — AB (ref 12.0–15.0)
LYMPHS ABS: 1.5 10*3/uL (ref 0.7–4.0)
LYMPHS PCT: 35 %
MCH: 33.1 pg (ref 26.0–34.0)
MCHC: 33.9 g/dL (ref 30.0–36.0)
MCV: 97.6 fL (ref 78.0–100.0)
MONOS PCT: 4 %
Monocytes Absolute: 0.2 10*3/uL (ref 0.1–1.0)
NEUTROS ABS: 2.3 10*3/uL (ref 1.7–7.7)
NEUTROS PCT: 52 %
Platelets: 301 10*3/uL (ref 150–400)
RBC: 2.93 MIL/uL — ABNORMAL LOW (ref 3.87–5.11)
RDW: 12.6 % (ref 11.5–15.5)
WBC: 4.4 10*3/uL (ref 4.0–10.5)

## 2018-04-22 MED ORDER — ALBUTEROL SULFATE (2.5 MG/3ML) 0.083% IN NEBU
5.0000 mg | INHALATION_SOLUTION | Freq: Once | RESPIRATORY_TRACT | Status: AC
  Administered 2018-04-22: 5 mg via RESPIRATORY_TRACT
  Filled 2018-04-22: qty 6

## 2018-04-22 MED ORDER — METHYLPREDNISOLONE SODIUM SUCC 125 MG IJ SOLR
125.0000 mg | Freq: Once | INTRAMUSCULAR | Status: AC
  Administered 2018-04-22: 125 mg via INTRAVENOUS
  Filled 2018-04-22: qty 2

## 2018-04-22 MED ORDER — ALBUTEROL (5 MG/ML) CONTINUOUS INHALATION SOLN
20.0000 mg/h | INHALATION_SOLUTION | RESPIRATORY_TRACT | Status: DC
  Administered 2018-04-22: 20 mg/h via RESPIRATORY_TRACT
  Filled 2018-04-22: qty 20

## 2018-04-22 NOTE — ED Triage Notes (Signed)
Pt states she is short of breath and wheezing  Pt states they were painting in the office today and she did not get out in time  Pt has hx of asthma  Pt recently discharged from hospital for same

## 2018-04-23 ENCOUNTER — Encounter (HOSPITAL_COMMUNITY): Payer: Self-pay | Admitting: Emergency Medicine

## 2018-04-23 DIAGNOSIS — J441 Chronic obstructive pulmonary disease with (acute) exacerbation: Secondary | ICD-10-CM | POA: Diagnosis present

## 2018-04-23 DIAGNOSIS — E0500 Thyrotoxicosis with diffuse goiter without thyrotoxic crisis or storm: Secondary | ICD-10-CM | POA: Diagnosis present

## 2018-04-23 DIAGNOSIS — E876 Hypokalemia: Secondary | ICD-10-CM | POA: Diagnosis present

## 2018-04-23 DIAGNOSIS — E669 Obesity, unspecified: Secondary | ICD-10-CM | POA: Diagnosis present

## 2018-04-23 DIAGNOSIS — I10 Essential (primary) hypertension: Secondary | ICD-10-CM | POA: Diagnosis not present

## 2018-04-23 DIAGNOSIS — E079 Disorder of thyroid, unspecified: Secondary | ICD-10-CM | POA: Diagnosis not present

## 2018-04-23 DIAGNOSIS — E119 Type 2 diabetes mellitus without complications: Secondary | ICD-10-CM | POA: Diagnosis not present

## 2018-04-23 DIAGNOSIS — Z6837 Body mass index (BMI) 37.0-37.9, adult: Secondary | ICD-10-CM | POA: Diagnosis not present

## 2018-04-23 DIAGNOSIS — Z79899 Other long term (current) drug therapy: Secondary | ICD-10-CM | POA: Diagnosis not present

## 2018-04-23 DIAGNOSIS — D649 Anemia, unspecified: Secondary | ICD-10-CM | POA: Diagnosis not present

## 2018-04-23 DIAGNOSIS — R000 Tachycardia, unspecified: Secondary | ICD-10-CM

## 2018-04-23 DIAGNOSIS — J9601 Acute respiratory failure with hypoxia: Secondary | ICD-10-CM | POA: Insufficient documentation

## 2018-04-23 DIAGNOSIS — D72829 Elevated white blood cell count, unspecified: Secondary | ICD-10-CM | POA: Diagnosis present

## 2018-04-23 DIAGNOSIS — T380X5A Adverse effect of glucocorticoids and synthetic analogues, initial encounter: Secondary | ICD-10-CM | POA: Diagnosis present

## 2018-04-23 DIAGNOSIS — J45901 Unspecified asthma with (acute) exacerbation: Secondary | ICD-10-CM | POA: Diagnosis present

## 2018-04-23 DIAGNOSIS — Z77098 Contact with and (suspected) exposure to other hazardous, chiefly nonmedicinal, chemicals: Secondary | ICD-10-CM | POA: Diagnosis present

## 2018-04-23 DIAGNOSIS — Z7951 Long term (current) use of inhaled steroids: Secondary | ICD-10-CM | POA: Diagnosis not present

## 2018-04-23 DIAGNOSIS — R0602 Shortness of breath: Secondary | ICD-10-CM | POA: Diagnosis present

## 2018-04-23 DIAGNOSIS — R0902 Hypoxemia: Secondary | ICD-10-CM | POA: Diagnosis present

## 2018-04-23 DIAGNOSIS — Z90710 Acquired absence of both cervix and uterus: Secondary | ICD-10-CM | POA: Diagnosis not present

## 2018-04-23 DIAGNOSIS — J45909 Unspecified asthma, uncomplicated: Secondary | ICD-10-CM | POA: Diagnosis not present

## 2018-04-23 DIAGNOSIS — Z79890 Hormone replacement therapy (postmenopausal): Secondary | ICD-10-CM | POA: Diagnosis not present

## 2018-04-23 LAB — CBC
HEMATOCRIT: 30.8 % — AB (ref 36.0–46.0)
HEMOGLOBIN: 10.1 g/dL — AB (ref 12.0–15.0)
MCH: 31.3 pg (ref 26.0–34.0)
MCHC: 32.8 g/dL (ref 30.0–36.0)
MCV: 95.4 fL (ref 78.0–100.0)
Platelets: 224 10*3/uL (ref 150–400)
RBC: 3.23 MIL/uL — AB (ref 3.87–5.11)
RDW: 13.7 % (ref 11.5–15.5)
WBC: 5.9 10*3/uL (ref 4.0–10.5)

## 2018-04-23 LAB — BASIC METABOLIC PANEL
ANION GAP: 7 (ref 5–15)
BUN: 16 mg/dL (ref 6–20)
CHLORIDE: 107 mmol/L (ref 98–111)
CO2: 25 mmol/L (ref 22–32)
CREATININE: 0.6 mg/dL (ref 0.44–1.00)
Calcium: 9.1 mg/dL (ref 8.9–10.3)
GFR calc non Af Amer: >60 mL/min (ref >60)
Glucose, Bld: 124 mg/dL — ABNORMAL HIGH (ref 70–99)
Potassium: 3.3 mmol/L — ABNORMAL LOW (ref 3.5–5.1)
Sodium: 139 mmol/L (ref 135–145)

## 2018-04-23 LAB — CREATININE, SERUM: CREATININE: 0.63 mg/dL (ref 0.44–1.00)

## 2018-04-23 LAB — MAGNESIUM: Magnesium: 1.8 mg/dL (ref 1.7–2.4)

## 2018-04-23 LAB — D-DIMER, QUANTITATIVE: D-Dimer, Quant: 0.52 ug/mL-FEU — ABNORMAL HIGH (ref 0.00–0.50)

## 2018-04-23 LAB — TROPONIN I

## 2018-04-23 MED ORDER — LEVOTHYROXINE SODIUM 75 MCG PO TABS
175.0000 ug | ORAL_TABLET | Freq: Every day | ORAL | Status: DC
  Administered 2018-04-23 – 2018-04-25 (×3): 175 ug via ORAL
  Filled 2018-04-23 (×3): qty 1

## 2018-04-23 MED ORDER — SODIUM CHLORIDE 0.9 % IV SOLN: 250.0000 mL | INTRAVENOUS | Status: DC | PRN

## 2018-04-23 MED ORDER — MOMETASONE FURO-FORMOTEROL FUM 200-5 MCG/ACT IN AERO
2.0000 | INHALATION_SPRAY | Freq: Two times a day (BID) | RESPIRATORY_TRACT | Status: DC
  Administered 2018-04-23: 2 via RESPIRATORY_TRACT
  Filled 2018-04-23: qty 8.8

## 2018-04-23 MED ORDER — PREDNISONE 20 MG PO TABS
40.0000 mg | ORAL_TABLET | Freq: Every day | ORAL | Status: DC
  Administered 2018-04-25: 40 mg via ORAL
  Filled 2018-04-23 (×2): qty 2

## 2018-04-23 MED ORDER — IPRATROPIUM-ALBUTEROL 0.5-2.5 (3) MG/3ML IN SOLN
3.0000 mL | Freq: Three times a day (TID) | RESPIRATORY_TRACT | Status: DC
  Administered 2018-04-23 – 2018-04-25 (×5): 3 mL via RESPIRATORY_TRACT
  Filled 2018-04-23 (×5): qty 3

## 2018-04-23 MED ORDER — SODIUM CHLORIDE 0.9% FLUSH
3.0000 mL | Freq: Two times a day (BID) | INTRAVENOUS | Status: DC
  Administered 2018-04-23 – 2018-04-25 (×3): 3 mL via INTRAVENOUS

## 2018-04-23 MED ORDER — MAGNESIUM SULFATE 2 GM/50ML IV SOLN
2.0000 g | Freq: Once | INTRAVENOUS | Status: AC
  Administered 2018-04-23: 2 g via INTRAVENOUS
  Filled 2018-04-23: qty 50

## 2018-04-23 MED ORDER — ONDANSETRON HCL 4 MG/2ML IJ SOLN
4.0000 mg | Freq: Four times a day (QID) | INTRAMUSCULAR | Status: DC | PRN
  Administered 2018-04-23: 4 mg via INTRAVENOUS
  Filled 2018-04-23: qty 2

## 2018-04-23 MED ORDER — POLYETHYLENE GLYCOL 3350 17 G PO PACK
17.0000 g | PACK | Freq: Every day | ORAL | Status: DC | PRN
  Administered 2018-04-23 – 2018-04-24 (×2): 17 g via ORAL
  Filled 2018-04-23 (×2): qty 1

## 2018-04-23 MED ORDER — IPRATROPIUM-ALBUTEROL 0.5-2.5 (3) MG/3ML IN SOLN
3.0000 mL | Freq: Four times a day (QID) | RESPIRATORY_TRACT | Status: DC
  Administered 2018-04-23 (×2): 3 mL via RESPIRATORY_TRACT
  Filled 2018-04-23 (×2): qty 3

## 2018-04-23 MED ORDER — ONDANSETRON HCL 4 MG PO TABS: 4.0000 mg | ORAL_TABLET | Freq: Four times a day (QID) | ORAL | Status: DC | PRN

## 2018-04-23 MED ORDER — POTASSIUM CHLORIDE CRYS ER 20 MEQ PO TBCR
40.0000 meq | EXTENDED_RELEASE_TABLET | Freq: Once | ORAL | Status: AC
  Administered 2018-04-23: 40 meq via ORAL
  Filled 2018-04-23: qty 2

## 2018-04-23 MED ORDER — MONTELUKAST SODIUM 10 MG PO TABS
10.0000 mg | ORAL_TABLET | Freq: Every day | ORAL | Status: DC
  Administered 2018-04-23 – 2018-04-24 (×2): 10 mg via ORAL
  Filled 2018-04-23 (×2): qty 1

## 2018-04-23 MED ORDER — ACETAMINOPHEN 325 MG PO TABS
650.0000 mg | ORAL_TABLET | Freq: Four times a day (QID) | ORAL | Status: DC | PRN
  Administered 2018-04-23 (×2): 650 mg via ORAL
  Filled 2018-04-23 (×2): qty 2

## 2018-04-23 MED ORDER — DOXYCYCLINE HYCLATE 100 MG PO TABS
100.0000 mg | ORAL_TABLET | Freq: Two times a day (BID) | ORAL | Status: DC
  Administered 2018-04-23 – 2018-04-25 (×5): 100 mg via ORAL
  Filled 2018-04-23 (×5): qty 1

## 2018-04-23 MED ORDER — PNEUMOCOCCAL VAC POLYVALENT 25 MCG/0.5ML IJ INJ
0.5000 mL | INJECTION | INTRAMUSCULAR | Status: DC
  Filled 2018-04-23: qty 0.5

## 2018-04-23 MED ORDER — FLUTICASONE FUROATE-VILANTEROL 100-25 MCG/INH IN AEPB
1.0000 | INHALATION_SPRAY | Freq: Every day | RESPIRATORY_TRACT | Status: DC
  Administered 2018-04-23 – 2018-04-25 (×3): 1 via RESPIRATORY_TRACT
  Filled 2018-04-23: qty 28

## 2018-04-23 MED ORDER — SODIUM CHLORIDE 0.9% FLUSH: 3.0000 mL | INTRAVENOUS | Status: DC | PRN

## 2018-04-23 MED ORDER — ACETAMINOPHEN 650 MG RE SUPP: 650.0000 mg | Freq: Four times a day (QID) | RECTAL | Status: DC | PRN

## 2018-04-23 MED ORDER — FLUTICASONE PROPIONATE 50 MCG/ACT NA SUSP
1.0000 | Freq: Every day | NASAL | Status: DC
  Administered 2018-04-23 – 2018-04-25 (×3): 1 via NASAL
  Filled 2018-04-23: qty 16

## 2018-04-23 MED ORDER — ENOXAPARIN SODIUM 40 MG/0.4ML ~~LOC~~ SOLN
40.0000 mg | SUBCUTANEOUS | Status: DC
  Administered 2018-04-23: 40 mg via SUBCUTANEOUS
  Filled 2018-04-23 (×2): qty 0.4

## 2018-04-23 MED ORDER — ALBUTEROL SULFATE (2.5 MG/3ML) 0.083% IN NEBU: 3.0000 mL | INHALATION_SOLUTION | RESPIRATORY_TRACT | Status: DC | PRN

## 2018-04-23 MED ORDER — GUAIFENESIN-DM 100-10 MG/5ML PO SYRP
5.0000 mL | ORAL_SOLUTION | Freq: Three times a day (TID) | ORAL | Status: DC | PRN
  Administered 2018-04-23: 5 mL via ORAL
  Filled 2018-04-23: qty 10

## 2018-04-23 MED ORDER — METHYLPREDNISOLONE SODIUM SUCC 125 MG IJ SOLR
60.0000 mg | Freq: Two times a day (BID) | INTRAMUSCULAR | Status: AC
  Administered 2018-04-23 (×2): 60 mg via INTRAVENOUS
  Filled 2018-04-23 (×2): qty 2

## 2018-04-23 NOTE — Progress Notes (Signed)
Nutrition Brief Note  RD consulted as part of the COPD Protocol.  RD spoke with pt and mother at bedside. Pt reports typically eating 3 meals daily but that she works and sometimes does not have an appetite while working. Pt reports breakfast includes strawberries and cherries with cream cheese or a bowl of grits. Lunch includes a salad or sub sandwich. Dinner includes fast food. Pt reports trying to drink a gallon of water daily.  Pt denies any recent weight loss. No evidence of subcutaneous fat or muscle depletions.  Wt Readings from Last 15 Encounters:  04/23/18 234 lb (106.1 kg)  04/02/18 198 lb (89.8 kg)  03/09/17 200 lb (90.7 kg)  06/15/16 217 lb 11.2 oz (98.7 kg)  06/04/16 217 lb 14.4 oz (98.8 kg)  12/07/15 200 lb (90.7 kg)  06/13/15 190 lb (86.2 kg)  10/22/11 180 lb (81.6 kg)  10/19/11 187 lb (84.8 kg)  09/07/11 187 lb (84.8 kg)    Body mass index is 37.77 kg/m. Patient meets criteria for Obesity Class II based on current BMI.   Current diet order is Regular. No meal completion charted at this time. Labs and medications reviewed.   No nutrition interventions warranted at this time. If nutrition issues arise, please consult RD.    Maria ReadingKate Everett Maria Figgs, MS, RD, LDN Pager: [phone] Weekend/After Hours: [phone]

## 2018-04-23 NOTE — ED Notes (Signed)
Report given to Jonny RuizJohn, [facility]

## 2018-04-23 NOTE — ED Provider Notes (Signed)
[facility] Provider Note   CSN: [id number] Arrival date & time: 04/22/18  1953     History   Chief Complaint Chief Complaint  Patient presents with  . Shortness of Breath    HPI Maria Everett is a 52 y.o. female.  The history is provided by the patient.  Shortness of Breath  This is a recurrent problem. The average episode lasts 1 day. The problem occurs continuously.The current episode started 12 to 24 hours ago. The problem has not changed since onset.Associated symptoms include wheezing. Pertinent negatives include no fever, no chest pain, no vomiting and no leg swelling. The problem's precipitants include medical treatment. The treatment provided no relief.  Patient with recent d/c from hospital for COPD presents with recurrence.  States there was painting in office and this caused a flare.  No steroids since admission.    Past Medical History:  Diagnosis Date  . Asthma   . Bronchitis   . COPD (chronic obstructive pulmonary disease) (HCC)   . Grave's disease   . Hypothyroidism   . Shortness of breath 10/22/11   "all the time right now"    Patient Active Problem List   Diagnosis Date Noted  . Acute respiratory failure with hypoxia (HCC) 04/23/2018  . COPD exacerbation (HCC) 04/03/2018  . Acute respiratory failure (HCC) 06/15/2016  . Abnormal ECG 06/15/2016  . Acute hyperglycemia 06/15/2016  . Hypothyroidism 06/04/2016  . Hypokalemia 06/04/2016  . Acute asthmatic bronchitis 10/22/2011  . Thyroid disease 10/22/2011  . Grave's disease     Past Surgical History:  Procedure Laterality Date  . ABDOMINAL HYSTERECTOMY  11/2007  . ANKLE SURGERY Left   . APPENDECTOMY  11/2007  . CESAREAN SECTION  1986; 1987; 1993; 1997     OB History   None      Home Medications    Prior to Admission medications   Medication Sig Start Date End Date Taking? Authorizing Provider  albuterol (PROVENTIL HFA;VENTOLIN HFA) 108 (90 BASE)  MCG/ACT inhaler Inhale 2 puffs into the lungs every 4 (four) hours as needed for wheezing or shortness of breath. 06/14/15  Yes Horton, Mayer Masker, MD  fluticasone (FLONASE) 50 MCG/ACT nasal spray Place 1-2 sprays into both nostrils daily. 04/07/18  Yes Narda Bonds, MD  fluticasone furoate-vilanterol (BREO ELLIPTA) 100-25 MCG/INH AEPB Inhale 1 puff into the lungs daily. 04/07/18  Yes Narda Bonds, MD  levothyroxine (SYNTHROID, LEVOTHROID) 175 MCG tablet Take 175 mcg by mouth daily.    Yes [provider]  montelukast (SINGULAIR) 10 MG tablet Take 10 mg by mouth at bedtime.   Yes [provider]  guaiFENesin-dextromethorphan (ROBITUSSIN DM) 100-10 MG/5ML syrup Take 5 mLs by mouth 3 (three) times daily as needed for cough. Patient not taking: Reported on 04/23/2018 03/09/17   Benjiman Core, MD    Family History Family History  Problem Relation Age of Onset  . Sarcoidosis Mother     Social History Social History   Tobacco Use  . Smoking status: Never Smoker  . Smokeless tobacco: Never Used  Substance Use Topics  . Alcohol use: No  . Drug use: No     Allergies   Patient has no known allergies.   Review of Systems Review of Systems  Constitutional: Negative for fever.  Respiratory: Positive for shortness of breath and wheezing.   Cardiovascular: Negative for chest pain, palpitations and leg swelling.  Gastrointestinal: Negative for vomiting.  All other systems reviewed and are negative.  Physical Exam Updated Vital Signs BP 140/77 (BP Location: Right Arm)   Pulse 91   Temp 98 F (36.7 C) (Oral)   Resp 16   Ht 5\' 6"  (1.676 m)   Wt 106.1 kg (234 lb)   SpO2 96%   BMI 37.77 kg/m   Physical Exam  Constitutional: She appears well-developed and well-nourished.  HENT:  Head: Normocephalic and atraumatic.  Mouth/Throat: No oropharyngeal exudate.  Eyes: Pupils are equal, round, and reactive to light. Conjunctivae are normal.  Neck: Normal range of  motion. Neck supple.  Cardiovascular: Normal rate, regular rhythm, normal heart sounds and intact distal pulses.  Pulmonary/Chest: Tachypnea noted. She has wheezes.  Abdominal: Soft. Bowel sounds are normal. There is no tenderness.  Musculoskeletal: Normal range of motion. She exhibits no edema or tenderness.  Neurological: She is alert. She displays normal reflexes.  Skin: Skin is warm and dry. Capillary refill takes less than 2 seconds. She is not diaphoretic.  Psychiatric: She has a normal mood and affect.  Nursing note and vitals reviewed.    ED Treatments / Results  Labs (all labs ordered are listed, but only abnormal results are displayed) Labs Reviewed  CBC WITH DIFFERENTIAL/PLATELET - Abnormal; Notable for the following components:      Result Value   RBC 2.93 (*)    Hemoglobin 9.7 (*)    HCT 28.6 (*)    All other components within normal limits  BASIC METABOLIC PANEL - Abnormal; Notable for the following components:   Potassium 3.3 (*)    Glucose, Bld 124 (*)    All other components within normal limits  CBC - Abnormal; Notable for the following components:   RBC 3.23 (*)    Hemoglobin 10.1 (*)    HCT 30.8 (*)    All other components within normal limits  D-DIMER, QUANTITATIVE (NOT AT [facility]) - Abnormal; Notable for the following components:   D-Dimer, Quant 0.52 (*)    All other components within normal limits  TROPONIN I  MAGNESIUM  CREATININE, SERUM    EKG EKG Interpretation  Date/Time:  Friday April 23 2018 00:08:45 EDT Ventricular Rate:  93 PR Interval:    QRS Duration: 82 QT Interval:  353 QTC Calculation: 439 R Axis:   79 Text Interpretation:  Sinus rhythm Borderline repolarization abnormality Confirmed by Nicanor AlconPalumbo, Jalynn Waddell ([id number]) on 04/23/2018 1:12:31 AM Also confirmed by Nicanor AlconPalumbo, Martavis Gurney ([id number]), editor Elita QuickWatlington, [place] [phone]  on 04/23/2018 7:25:22 AM   Radiology Dg Chest 2 View  Result Date: 04/22/2018 CLINICAL DATA:  Dyspnea and wheeze EXAM:  CHEST - 2 VIEW COMPARISON:  04/03/2018 FINDINGS: The heart size and mediastinal contours are within normal limits. Mild bronchitic change of the lungs with increased interstitial lung markings and mild peribronchial thickening. No alveolar consolidation, CHF, effusion or pneumothorax. The visualized skeletal structures are unremarkable. IMPRESSION: Mild bronchitic change of the lungs.  No pulmonary consolidations. Electronically Signed   By: Tollie Ethavid  Kwon M.D.   On: 04/22/2018 21:10    Procedures Procedures (including critical care time)  Medications Ordered in ED Medications  albuterol (PROVENTIL,VENTOLIN) solution continuous neb (0 mg/hr Nebulization Stopped 04/23/18 0030)  pneumococcal 23 valent vaccine (PNU-IMMUNE) injection 0.5 mL (has no administration in time range)  albuterol (PROVENTIL) (2.5 MG/3ML) 0.083% nebulizer solution 3 mL (has no administration in time range)  fluticasone (FLONASE) 50 MCG/ACT nasal spray 1-2 spray (1 spray Each Nare Given 04/23/18 0840)  fluticasone furoate-vilanterol (BREO ELLIPTA) 100-25 MCG/INH 1 puff (1 puff Inhalation Given 04/23/18 0815)  levothyroxine (SYNTHROID, LEVOTHROID) tablet 175 mcg (175 mcg Oral Given 04/23/18 0839)  montelukast (SINGULAIR) tablet 10 mg (has no administration in time range)  guaiFENesin-dextromethorphan (ROBITUSSIN DM) 100-10 MG/5ML syrup 5 mL (has no administration in time range)  enoxaparin (LOVENOX) injection 40 mg (has no administration in time range)  sodium chloride flush (NS) 0.9 % injection 3 mL (has no administration in time range)  sodium chloride flush (NS) 0.9 % injection 3 mL (has no administration in time range)  0.9 %  sodium chloride infusion (has no administration in time range)  acetaminophen (TYLENOL) tablet 650 mg (650 mg Oral Given 04/23/18 0949)    Or  acetaminophen (TYLENOL) suppository 650 mg ( Rectal See Alternative 04/23/18 0949)  polyethylene glycol (MIRALAX / GLYCOLAX) packet 17 g (has no administration in  time range)  ondansetron (ZOFRAN) tablet 4 mg ( Oral See Alternative 04/23/18 0949)    Or  ondansetron (ZOFRAN) injection 4 mg (4 mg Intravenous Given 04/23/18 0949)  doxycycline (VIBRA-TABS) tablet 100 mg (100 mg Oral Given 04/23/18 0839)  methylPREDNISolone sodium succinate (SOLU-MEDROL) 125 mg/2 mL injection 60 mg (60 mg Intravenous Given 04/23/18 0839)    Followed by  predniSONE (DELTASONE) tablet 40 mg (has no administration in time range)  mometasone-formoterol (DULERA) 200-5 MCG/ACT inhaler 2 puff (2 puffs Inhalation Given 04/23/18 0816)  ipratropium-albuterol (DUONEB) 0.5-2.5 (3) MG/3ML nebulizer solution 3 mL (3 mLs Nebulization Given 04/23/18 0807)  albuterol (PROVENTIL) (2.5 MG/3ML) 0.083% nebulizer solution 5 mg (5 mg Nebulization Given 04/22/18 2005)  albuterol (PROVENTIL) (2.5 MG/3ML) 0.083% nebulizer solution 5 mg (5 mg Nebulization Given 04/22/18 2102)  albuterol (PROVENTIL) (2.5 MG/3ML) 0.083% nebulizer solution 5 mg (5 mg Nebulization Given 04/22/18 2303)  methylPREDNISolone sodium succinate (SOLU-MEDROL) 125 mg/2 mL injection 125 mg (125 mg Intravenous Given 04/22/18 2330)  magnesium sulfate IVPB 2 g 50 mL (0 g Intravenous Stopped 04/23/18 0123)  potassium chloride SA (K-DUR,KLOR-CON) CR tablet 40 mEq (40 mEq Oral Given 04/23/18 0839)     MDM Reviewed: previous chart, nursing note and vitals Reviewed previous: labs, ECG and x-ray (No PNA on cxr by me negative troponin) Total time providing critical care: 30-74 minutes (continuous nebs and magnesium). This excludes time spent performing separately reportable procedures and services. Consults: admitting MD  CRITICAL CARE Performed by: Jasmine Awe Total critical care time: 60 minutes Critical care time was exclusive of separately billable procedures and treating other patients. Critical care was necessary to treat or prevent imminent or life-threatening deterioration. Critical care was time spent personally by me on the  following activities: development of treatment plan with patient and/or surrogate as well as nursing, discussions with consultants, evaluation of patient's response to treatment, examination of patient, obtaining history from patient or surrogate, ordering and performing treatments and interventions, ordering and review of laboratory studies, ordering and review of radiographic studies, pulse oximetry and re-evaluation of patient's condition.  Final Clinical Impressions(s) / ED Diagnoses   Final diagnoses:  COPD exacerbation (HCC)   Patient with ongoing wheezing and hypoxia in the face of CAT and steroids and IV magnesium, requires admission.    The patient appears reasonably stabilized for admission considering the current resources, flow, and capabilities available in the ED at this time, and I doubt any other [facility] requiring further screening and/or treatment in the ED prior to admission.   Peng Thorstenson, MD 04/23/18 1036

## 2018-04-23 NOTE — Progress Notes (Signed)
Continuous nebulizer started to deliver 20mg /hr of albuterol.

## 2018-04-23 NOTE — H&P (Signed)
[facility] History and Physical  Maria Everett UJW:[id number] DOB: [birth year] DOA: 04/22/2018  PCP: Darryl Lent, PA-C  Patient coming from: Home  Chief Complaint: Shortness of breath  HPI: Maria Everett is a 52 y.o. female with a medical history of COPD, hypothyroidism, who presented to the emergency department with complaints of shortness of breath and cough.  She was discharged from the hospital approximately 2 weeks ago and was feeling much better.  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  Patient states she was using her albuterol as well as nebulizer treatments without any improvement of her symptoms.  She also feels that she has a cough and would like to get something up but is unable to.  She complains of feeling her heart race from time to time but more frequently lately. She denies any chest pressure or pain, abdominal pain, nausea or vomiting, diarrhea or constipation, dizziness or headache, problems with urination, recent travel.  Patient did state that she had ankle surgery a few months ago, but has been active.  ED Course: Found to have shortness of breath and given treatment along with magnesium.  Patient transferred to [facility] from med center [facility].  TR[facility] to admit.  Review of Systems:  All other systems reviewed and are negative.   Past Medical History:  Diagnosis Date  . Asthma   . Bronchitis   . COPD (chronic obstructive pulmonary disease) (HCC)   . Grave's disease   . Hypothyroidism   . Shortness of breath 10/22/11   "all the time right now"    Past Surgical History:  Procedure Laterality Date  . ABDOMINAL HYSTERECTOMY  11/2007  . ANKLE SURGERY Left   . APPENDECTOMY  11/2007  . CESAREAN SECTION  1986; 1987; 1993; 1997    Social History:  reports that she has never smoked. She has never used smokeless tobacco. She reports that she does not drink alcohol or use  drugs.  No Known Allergies  Family History  Problem Relation Age of Onset  . Sarcoidosis Mother     Prior to Admission medications   Medication Sig Start Date End Date Taking? Authorizing Provider  albuterol (PROVENTIL HFA;VENTOLIN HFA) 108 (90 BASE) MCG/ACT inhaler Inhale 2 puffs into the lungs every 4 (four) hours as needed for wheezing or shortness of breath. 06/14/15  Yes Horton, Mayer Masker, MD  fluticasone (FLONASE) 50 MCG/ACT nasal spray Place 1-2 sprays into both nostrils daily. 04/07/18  Yes Narda Bonds, MD  fluticasone furoate-vilanterol (BREO ELLIPTA) 100-25 MCG/INH AEPB Inhale 1 puff into the lungs daily. 04/07/18  Yes Narda Bonds, MD  levothyroxine (SYNTHROID, LEVOTHROID) 175 MCG tablet Take 175 mcg by mouth daily.    Yes [provider]  montelukast (SINGULAIR) 10 MG tablet Take 10 mg by mouth at bedtime.   Yes [provider]  guaiFENesin-dextromethorphan (ROBITUSSIN DM) 100-10 MG/5ML syrup Take 5 mLs by mouth 3 (three) times daily as needed for cough. Patient not taking: Reported on 04/23/2018 03/09/17   Benjiman Core, MD    Physical Exam: Vitals:   04/23/18 0123 04/23/18 0349  BP: (!) 143/76 140/77  Pulse: 98 91  Resp: 19 16  Temp:  98 F (36.7 C)  SpO2: 96% 100%     General: Well developed, well nourished, NAD, appears stated age  HEENT: NCAT, PERRLA, EOMI, Anicteic Sclera, mucous membranes moist.  Nasal congestion  Neck: Supple, no JVD, no  masses  Cardiovascular: S1 S2 auscultated, no rubs, murmurs or gallops.  Tachycardic  Respiratory: Sounds, diffuse expiratory wheezing, occasional cough  Abdomen: Soft, obese, nontender, nondistended, + bowel sounds  Extremities: warm dry without cyanosis clubbing or edema  Neuro: AAOx3, cranial nerves grossly intact. Strength 5/5 in patient's upper and lower extremities bilaterally  Skin: Without rashes exudates or nodules  Psych: Normal affect and demeanor with intact judgement and  insight  Labs on Admission: I have personally reviewed following labs and imaging studies CBC: Recent Labs  Lab 04/22/18 2339  WBC 4.4  NEUTROABS 2.3  HGB 9.7*  HCT 28.6*  MCV 97.6  PLT 301   Basic Metabolic Panel: Recent Labs  Lab 04/22/18 2339  NA 139  K 3.3*  CL 107  CO2 25  GLUCOSE 124*  BUN 16  CREATININE 0.60  CALCIUM 9.1  MG 1.8   GFR: Estimated Creatinine Clearance: 103.6 mL/min (by C-G formula based on SCr of 0.6 mg/dL). Liver Function Tests: No results for input(s): AST, ALT, ALKPHOS, BILITOT, PROT, ALBUMIN in the last 168 hours. No results for input(s): LIPASE, AMYLASE in the last 168 hours. No results for input(s): AMMONIA in the last 168 hours. Coagulation Profile: No results for input(s): INR, PROTIME in the last 168 hours. Cardiac Enzymes: Recent Labs  Lab 04/22/18 2339  TROPONINI <0.03   BNP (last 3 results) No results for input(s): PROBNP in the last 8760 hours. HbA1C: No results for input(s): HGBA1C in the last 72 hours. CBG: No results for input(s): GLUCAP in the last 168 hours. Lipid Profile: No results for input(s): CHOL, HDL, LDLCALC, TRIG, CHOLHDL, LDLDIRECT in the last 72 hours. Thyroid Function Tests: No results for input(s): TSH, T4TOTAL, FREET4, T3FREE, THYROIDAB in the last 72 hours. Anemia Panel: No results for input(s): VITAMINB12, FOLATE, FERRITIN, TIBC, IRON, RETICCTPCT in the last 72 hours. Urine analysis:    Component Value Date/Time   COLORURINE YELLOW 06/04/2016 1820   APPEARANCEUR CLEAR 06/04/2016 1820   LABSPEC 1.011 06/04/2016 1820   PHURINE 6.0 06/04/2016 1820   GLUCOSEU NEGATIVE 06/04/2016 1820   HGBUR NEGATIVE 06/04/2016 1820   BILIRUBINUR NEGATIVE 06/04/2016 1820   KETONESUR NEGATIVE 06/04/2016 1820   PROTEINUR NEGATIVE 06/04/2016 1820   UROBILINOGEN 1.0 10/24/2011 1154   NITRITE NEGATIVE 06/04/2016 1820   LEUKOCYTESUR NEGATIVE 06/04/2016 1820   Sepsis Labs: @LABRCNTIP (procalcitonin:4,lacticidven:4) )No  results found for this or any previous visit (from the past 240 hour(s)).   Radiological Exams on Admission: Dg Chest 2 View  Result Date: 04/22/2018 CLINICAL DATA:  Dyspnea and wheeze EXAM: CHEST - 2 VIEW COMPARISON:  04/03/2018 FINDINGS: The heart size and mediastinal contours are within normal limits. Mild bronchitic change of the lungs with increased interstitial lung markings and mild peribronchial thickening. No alveolar consolidation, CHF, effusion or pneumothorax. The visualized skeletal structures are unremarkable. IMPRESSION: Mild bronchitic change of the lungs.  No pulmonary consolidations. Electronically Signed   By: Tollie Ethavid  Kwon M.D.   On: 04/22/2018 21:10    EKG: Independently reviewed.  Sinus rhythm, rate 93  Assessment/Plan  COPD exacerbation -Recently admitted on 04/03/2018 and discharged on 04/07/2018 for COPD exacerbation -Will use COPD order set and Aldult wheeze protocol -will place on nebs, solumedrol, doxycycline, antitussives  -CXR: mild bronchitic change. No infection noted -Will order incentive spirometry, flutter valve -Continue Breo  Hypokalemia -will replace and continue to monitor BMP -magnesium 1.8, appears to have received some in the ED  Nasal congestion/Alleriges -Continue flonase and singulair  Hypothyroidism -Continue synthroid  Chronic normocytic anemia  -Hemoglobin currently 9.7 baseline appears to be approximately 10-11 -Continue to monitor CBC  Tachycardia -patient states she feels her heart racing from time to time -recently had surgery on her left ankle  -given SOB, tachycardia, and recent surgery, will order Ddimer  DVT prophylaxis: Lovenox  Code Status: Full  Family Communication: None at bedside. Admission, patients condition and plan of care including tests being ordered have been discussed with the patient, who indicates understanding and agrees with the plan and Code Status.  Disposition Plan: Home   Consults called: None    Admission status: Admitted   Time spent: 70 minutes  Rebbecca Osuna D.O. [facility] Pager [phone]  If 7PM-7AM, please contact night-coverage [email or web address] Password [facility] 04/23/2018, 8:04 AM

## 2018-04-23 NOTE — ED Notes (Signed)
Report given to Cindy with [facility] 

## 2018-04-23 NOTE — ED Notes (Signed)
Pt on monitor 

## 2018-04-24 ENCOUNTER — Inpatient Hospital Stay (HOSPITAL_COMMUNITY): Payer: BLUE CROSS/BLUE SHIELD

## 2018-04-24 DIAGNOSIS — E119 Type 2 diabetes mellitus without complications: Secondary | ICD-10-CM

## 2018-04-24 DIAGNOSIS — E669 Obesity, unspecified: Secondary | ICD-10-CM

## 2018-04-24 DIAGNOSIS — E079 Disorder of thyroid, unspecified: Secondary | ICD-10-CM

## 2018-04-24 DIAGNOSIS — E876 Hypokalemia: Secondary | ICD-10-CM

## 2018-04-24 DIAGNOSIS — I10 Essential (primary) hypertension: Secondary | ICD-10-CM

## 2018-04-24 DIAGNOSIS — J441 Chronic obstructive pulmonary disease with (acute) exacerbation: Principal | ICD-10-CM

## 2018-04-24 LAB — BASIC METABOLIC PANEL
Anion gap: 5 (ref 5–15)
BUN: 15 mg/dL (ref 6–20)
CHLORIDE: 111 mmol/L (ref 98–111)
CO2: 25 mmol/L (ref 22–32)
Calcium: 9.6 mg/dL (ref 8.9–10.3)
Creatinine, Ser: 0.63 mg/dL (ref 0.44–1.00)
GFR calc Af Amer: >60 mL/min (ref >60)
GLUCOSE: 129 mg/dL — AB (ref 70–99)
POTASSIUM: 4.8 mmol/L (ref 3.5–5.1)
Sodium: 141 mmol/L (ref 135–145)

## 2018-04-24 LAB — CBC
HCT: 31.6 % — ABNORMAL LOW (ref 36.0–46.0)
Hemoglobin: 10.3 g/dL — ABNORMAL LOW (ref 12.0–15.0)
MCH: 32.5 pg (ref 26.0–34.0)
MCHC: 32.6 g/dL (ref 30.0–36.0)
MCV: 99.7 fL (ref 78.0–100.0)
Platelets: 361 10*3/uL (ref 150–400)
RBC: 3.17 MIL/uL — ABNORMAL LOW (ref 3.87–5.11)
RDW: 14.1 % (ref 11.5–15.5)
WBC: 22.7 10*3/uL — AB (ref 4.0–10.5)

## 2018-04-24 LAB — MAGNESIUM: Magnesium: 2.3 mg/dL (ref 1.7–2.4)

## 2018-04-24 LAB — PROCALCITONIN

## 2018-04-24 MED ORDER — IOHEXOL 300 MG/ML  SOLN
75.0000 mL | Freq: Once | INTRAMUSCULAR | Status: AC | PRN
  Administered 2018-04-24: 75 mL via INTRAVENOUS

## 2018-04-24 NOTE — Progress Notes (Signed)
PROGRESS NOTE  Maria LawmanBernita Everett ZOX:[id number] DOB: [birth year] DOA: 04/22/2018 PCP: Darryl Lentaylor, Amanda, PA-C  HPI/Recap of past 24 hours: Maria LawmanBernita Everett is a 52 y.o. female with a medical history of COPD, asthma, hypothyroidism, who presented to the emergency department with complaints of shortness of breath and cough.  She was discharged from the hospital approximately 2 weeks ago and was feeling much better.  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  Admitted for acute COPD exacerbation.  05/11/2018: Patient examined at bedside.  She reports wheezing and some dyspnea on exertion.  Personally reviewed chest x-ray done on admission which revealed possible right lower lobe atelectasis versus others.  Afebrile with negative procalcitonin.  Patient concerned about not improving.  CT chest with contrast ordered and revealed no active cardiopulmonary abnormality.  No complication from asthma.  Minimal scarring in the anterior right upper lobe.    Assessment/Plan: Active Problems:   Thyroid disease   Hypokalemia   COPD exacerbation (HCC)   Acute COPD exacerbation/asthma exacerbation Most likely secondary to paint/chemical exposure No tobacco use history Continue breathing treatment Continue po steroids Continue p.o. doxycycline and Robitussin for cough O2 supplementation as needed to maintain O2 saturation greater than 90%  Leukocytosis, suspect reactive Secondary to IV steroid use Procalcitonin negative Afebrile Repeat CBC in the morning  Obesity BMI 37 Recommend weight loss outpatient with regular exercise and healthy eating  Asthma Continue asthma medications On Breo Ellipta and Singulair  Hypothyroidism Continue levothyroxine   Code Status: Full code  Family Communication: None at bedside  Disposition Plan: Home possibly tomorrow 04/25/2018 if no events  overnight   Consultants: None  Procedures:  None  Antimicrobials:  P.o. doxycycline  DVT prophylaxis: Subcu Lovenox daily   Objective: Vitals:   04/23/18 1906 04/23/18 2044 04/24/18 0546 04/24/18 0903  BP:  136/85 122/80   Pulse:  73 (!) 59   Resp:  18 14   Temp:  97.9 F (36.6 C) 98 F (36.7 C)   TempSrc:  Oral Oral   SpO2: 99% 98% 98% 98%  Weight:      Height:        Intake/Output Summary (Last 24 hours) at 04/24/2018 1257 Last data filed at 04/23/2018 1656 Gross per 24 hour  Intake 840 ml  Output -  Net 840 ml   Filed Weights   04/22/18 1957 04/23/18 0348  Weight: 89.8 kg (198 lb) 106.1 kg (234 lb)    Exam:  . General: 52 y.o. year-old female well developed well nourished in no acute distress.  Alert and oriented x3. . Cardiovascular: Regular rate and rhythm with no rubs or gallops.  No thyromegaly or JVD noted.   Marland Kitchen. Respiratory: Expiratory mild wheezes noted posteriorly and bilaterally.  No rales noted. . Abdomen: Soft nontender nondistended with normal bowel sounds x4 quadrants. . Musculoskeletal: No lower extremity edema. 2/4 pulses in all 4 extremities. . Skin: No ulcerative lesions noted or rashes . Psychiatry: Mood is appropriate for condition and setting   Data Reviewed: CBC: Recent Labs  Lab 04/22/18 2339 04/23/18 0822 04/24/18 0430  WBC 4.4 5.9 22.7*  NEUTROABS 2.3  --   --   HGB 9.7* 10.1* 10.3*  HCT 28.6* 30.8* 31.6*  MCV 97.6 95.4 99.7  PLT 301 224 361   Basic Metabolic Panel: Recent Labs  Lab 04/22/18 2339 04/23/18 0822 04/24/18 0430  NA 139  --  141  K 3.3*  --  4.8  CL 107  --  111  CO2 25  --  25  GLUCOSE 124*  --  129*  BUN 16  --  15  CREATININE 0.60 0.63 0.63  CALCIUM 9.1  --  9.6  MG 1.8  --  2.3   GFR: Estimated Creatinine Clearance: 103.6 mL/min (by C-G formula based on SCr of 0.63 mg/dL). Liver Function Tests: No results for input(s): AST, ALT, ALKPHOS, BILITOT, PROT, ALBUMIN in the last 168 hours. No  results for input(s): LIPASE, AMYLASE in the last 168 hours. No results for input(s): AMMONIA in the last 168 hours. Coagulation Profile: No results for input(s): INR, PROTIME in the last 168 hours. Cardiac Enzymes: Recent Labs  Lab 04/22/18 2339  TROPONINI <0.03   BNP (last 3 results) No results for input(s): PROBNP in the last 8760 hours. HbA1C: No results for input(s): HGBA1C in the last 72 hours. CBG: No results for input(s): GLUCAP in the last 168 hours. Lipid Profile: No results for input(s): CHOL, HDL, LDLCALC, TRIG, CHOLHDL, LDLDIRECT in the last 72 hours. Thyroid Function Tests: No results for input(s): TSH, T4TOTAL, FREET4, T3FREE, THYROIDAB in the last 72 hours. Anemia Panel: No results for input(s): VITAMINB12, FOLATE, FERRITIN, TIBC, IRON, RETICCTPCT in the last 72 hours. Urine analysis:    Component Value Date/Time   COLORURINE YELLOW 06/04/2016 1820   APPEARANCEUR CLEAR 06/04/2016 1820   LABSPEC 1.011 06/04/2016 1820   PHURINE 6.0 06/04/2016 1820   GLUCOSEU NEGATIVE 06/04/2016 1820   HGBUR NEGATIVE 06/04/2016 1820   BILIRUBINUR NEGATIVE 06/04/2016 1820   KETONESUR NEGATIVE 06/04/2016 1820   PROTEINUR NEGATIVE 06/04/2016 1820   UROBILINOGEN 1.0 10/24/2011 1154   NITRITE NEGATIVE 06/04/2016 1820   LEUKOCYTESUR NEGATIVE 06/04/2016 1820   Sepsis Labs: @LABRCNTIP (procalcitonin:4,lacticidven:4)  )No results found for this or any previous visit (from the past 240 hour(s)).    Studies: Ct Chest W Contrast  Result Date: 04/24/2018 CLINICAL DATA:  Asthma.  COPD. EXAM: CT CHEST WITH CONTRAST TECHNIQUE: Multidetector CT imaging of the chest was performed during intravenous contrast administration. CONTRAST:  75mL OMNIPAQUE IOHEXOL 300 MG/ML  SOLN COMPARISON:  None. FINDINGS: Cardiovascular: Normal heart size.  No pericardial effusion. Mediastinum/Nodes: The thyroid gland is not visualized. The trachea appears patent and is midline. Normal appearance of the esophagus.  No enlarged mediastinal or hilar lymph nodes. Lungs/Pleura: No pleural effusion. No airspace consolidation, atelectasis or pneumothorax. Scar like density is noted within the anterior right upper lobe, image 44/7. Upper Abdomen: No acute abnormality. Musculoskeletal: No chest wall abnormality. No acute or significant osseous findings. IMPRESSION: 1. No active cardiopulmonary abnormality. No complications from asthma. 2. Minimal scarring within the anterior right upper lobe. Electronically Signed   By: Signa Kell M.D.   On: 04/24/2018 11:42    Scheduled Meds: . doxycycline  100 mg Oral Q12H  . enoxaparin (LOVENOX) injection  40 mg Subcutaneous Q24H  . fluticasone  1-2 spray Each Nare Daily  . fluticasone furoate-vilanterol  1 puff Inhalation Daily  . ipratropium-albuterol  3 mL Nebulization TID  . levothyroxine  175 mcg Oral QAC breakfast  . montelukast  10 mg Oral QHS  . pneumococcal 23 valent vaccine  0.5 mL Intramuscular Tomorrow-1000  . predniSONE  40 mg Oral Q supper  . sodium chloride flush  3 mL Intravenous Q12H    Continuous Infusions: . sodium chloride    . albuterol Stopped (04/23/18 0030)     LOS: 1 day     Darlin Drop, MD  [facility] Pager [phone]  If 7PM-7AM, please contact night-coverage [email or web address] Password [facility] 04/24/2018, 12:57 PM

## 2018-04-25 DIAGNOSIS — J45909 Unspecified asthma, uncomplicated: Secondary | ICD-10-CM

## 2018-04-25 LAB — BASIC METABOLIC PANEL
ANION GAP: 7 (ref 5–15)
BUN: 22 mg/dL — ABNORMAL HIGH (ref 6–20)
CALCIUM: 9.5 mg/dL (ref 8.9–10.3)
CO2: 26 mmol/L (ref 22–32)
CREATININE: 0.65 mg/dL (ref 0.44–1.00)
Chloride: 109 mmol/L (ref 98–111)
Glucose, Bld: 82 mg/dL (ref 70–99)
Potassium: 4.2 mmol/L (ref 3.5–5.1)
SODIUM: 142 mmol/L (ref 135–145)

## 2018-04-25 LAB — CBC
HCT: 31.4 % — ABNORMAL LOW (ref 36.0–46.0)
Hemoglobin: 10.3 g/dL — ABNORMAL LOW (ref 12.0–15.0)
MCH: 32.7 pg (ref 26.0–34.0)
MCHC: 32.8 g/dL (ref 30.0–36.0)
MCV: 99.7 fL (ref 78.0–100.0)
Platelets: 337 K/uL (ref 150–400)
RBC: 3.15 MIL/uL — ABNORMAL LOW (ref 3.87–5.11)
RDW: 14 % (ref 11.5–15.5)
WBC: 10.4 K/uL (ref 4.0–10.5)

## 2018-04-25 LAB — RESPIRATORY PANEL BY PCR

## 2018-04-25 MED ORDER — PREDNISONE 20 MG PO TABS: 20.0000 mg | ORAL_TABLET | Freq: Every day | ORAL | 0 refills | Status: AC

## 2018-04-25 MED ORDER — IPRATROPIUM-ALBUTEROL 0.5-2.5 (3) MG/3ML IN SOLN: 3.0000 mL | Freq: Four times a day (QID) | RESPIRATORY_TRACT | 0 refills | Status: DC | PRN

## 2018-04-25 MED ORDER — DOXYCYCLINE HYCLATE 100 MG PO TABS: 100.0000 mg | ORAL_TABLET | Freq: Two times a day (BID) | ORAL | 0 refills | Status: AC

## 2018-04-25 MED ORDER — MONTELUKAST SODIUM 10 MG PO TABS: 10.0000 mg | ORAL_TABLET | Freq: Every day | ORAL | 0 refills | Status: AC

## 2018-04-25 NOTE — Discharge Summary (Signed)
Discharge Summary  Maria LawmanBernita Everett ZOX:[id number] DOB: Feb 24, 1966  PCP: Darryl Lentaylor, Amanda, PA-C  Admit date: 04/22/2018 Discharge date: 04/25/2018  Time spent: 25 minutes  Recommendations for Outpatient Follow-up:  1. Follow-up with your PCP 2. Take your medications as prescribed  Discharge Diagnoses:  Active Hospital Problems   Diagnosis Date Noted  . COPD exacerbation (HCC) 04/03/2018  . Hypokalemia 06/04/2016  . Thyroid disease 10/22/2011    Resolved Hospital Problems  No resolved problems to display.    Discharge Condition: Stable  Diet recommendation: Resume previous diet  Vitals:   04/25/18 0514 04/25/18 1005  BP: (!) 116/56   Pulse: (!) 54   Resp: 14   Temp: 97.8 F (36.6 C)   SpO2: 100% 97%    History of present illness:  Maria Powellis a 52 y.o.femalewith a medical history ofCOPD, asthma, hypothyroidism, who presented to the emergency department with complaints of shortness of breath and cough. She was discharged from the hospital approximately 2 weeks ago and was feeling much better. 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  Admitted for acute COPD/asthma exacerbation.  Tolerated her treatment well. CT chest with contrast revealed no active cardiopulmonary abnormality.  No complication from asthma.  Minimal scarring in the anterior right upper lobe.  04/25/18: Patient seen and examined at bedside.  Symptoms are much improved.  On the day of discharge, patient was hemodynamically stable.  She will need to follow-up with her PCP post hospitalization.    Hospital Course:  Active Problems:   Thyroid disease   Hypokalemia   COPD exacerbation (HCC)   Procedures:  None  Consultations:  None  Discharge Exam: BP (!) 116/56 (BP Location: Left Arm)   Pulse (!) 54   Temp 97.8 F (36.6 C) (Oral)   Resp 14   Ht 5\' 6"  (1.676 m)   Wt 106.1 kg (234 lb)   SpO2 97%    BMI 37.77 kg/m  . General: 52 y.o. year-old female well developed well nourished in no acute distress.  Alert and oriented x3. . Cardiovascular: Regular rate and rhythm with no rubs or gallops.  No thyromegaly or JVD noted.   Marland Kitchen. Respiratory: Clear to auscultation with no wheezes or rales. Good inspiratory effort. . Abdomen: Soft nontender nondistended with normal bowel sounds x4 quadrants. . Musculoskeletal: No lower extremity edema. 2/4 pulses in all 4 extremities. . Skin: No ulcerative lesions noted or rashes, . Psychiatry: Mood is appropriate for condition and setting  Discharge Instructions You were cared for by a hospitalist during your hospital stay. If you have any questions about your discharge medications or the care you received while you were in the hospital after you are discharged, you can call the unit and asked to speak with the hospitalist on call if the hospitalist that took care of you is not available. Once you are discharged, your primary care physician will handle any further medical issues. Please note that NO REFILLS for any discharge medications will be authorized once you are discharged, as it is imperative that you return to your primary care physician (or establish a relationship with a primary care physician if you do not have one) for your aftercare needs so that they can reassess your need for medications and monitor your lab values.  Discharge Instructions    DME Nebulizer machine   Complete by:  As directed    Patient needs a nebulizer to treat with the  following condition:  COPD (chronic obstructive pulmonary disease) (HCC)     Allergies as of 04/25/2018   No Known Allergies     Medication List    STOP taking these medications   guaiFENesin-dextromethorphan 100-10 MG/5ML syrup Commonly known as:  ROBITUSSIN DM     TAKE these medications   albuterol 108 (90 Base) MCG/ACT inhaler Commonly known as:  PROVENTIL HFA;VENTOLIN HFA Inhale 2 puffs into the lungs  every 4 (four) hours as needed for wheezing or shortness of breath.   doxycycline 100 MG tablet Commonly known as:  VIBRA-TABS Take 1 tablet (100 mg total) by mouth every 12 (twelve) hours for 5 days.   fluticasone 50 MCG/ACT nasal spray Commonly known as:  FLONASE Place 1-2 sprays into both nostrils daily.   fluticasone furoate-vilanterol 100-25 MCG/INH Aepb Commonly known as:  BREO ELLIPTA Inhale 1 puff into the lungs daily.   ipratropium-albuterol 0.5-2.5 (3) MG/3ML Soln Commonly known as:  DUONEB Take 3 mLs by nebulization every 6 (six) hours as needed.   levothyroxine 175 MCG tablet Commonly known as:  SYNTHROID, LEVOTHROID Take 175 mcg by mouth daily.   montelukast 10 MG tablet Commonly known as:  SINGULAIR Take 10 mg by mouth at bedtime.   predniSONE 20 MG tablet Commonly known as:  DELTASONE Take 1 tablet (20 mg total) by mouth daily with breakfast for 5 days.            Durable Medical Equipment  (From admission, onward)        Start     Ordered   04/25/18 0000  DME Nebulizer machine    Question:  Patient needs a nebulizer to treat with the following condition  Answer:  COPD (chronic obstructive pulmonary disease) (HCC)   04/25/18 1325     No Known Allergies Follow-up Information    Darryl Lent, PA-C. Call in 1 day(s).   Specialty:  Physician Assistant Why:  please call for an appointment. Contact information: [address] [place] [address] [phone]            The results of significant diagnostics from this hospitalization (including imaging, microbiology, ancillary and laboratory) are listed below for reference.    Significant Diagnostic Studies: Dg Chest 2 View  Result Date: 04/22/2018 CLINICAL DATA:  Dyspnea and wheeze EXAM: CHEST - 2 VIEW COMPARISON:  04/03/2018 FINDINGS: The heart size and mediastinal contours are within normal limits. Mild bronchitic change of the lungs with increased interstitial lung markings and  mild peribronchial thickening. No alveolar consolidation, CHF, effusion or pneumothorax. The visualized skeletal structures are unremarkable. IMPRESSION: Mild bronchitic change of the lungs.  No pulmonary consolidations. Electronically Signed   By: Tollie Eth M.D.   On: 04/22/2018 21:10   Ct Chest W Contrast  Result Date: 04/24/2018 CLINICAL DATA:  Asthma.  COPD. EXAM: CT CHEST WITH CONTRAST TECHNIQUE: Multidetector CT imaging of the chest was performed during intravenous contrast administration. CONTRAST:  75mL OMNIPAQUE IOHEXOL 300 MG/ML  SOLN COMPARISON:  None. FINDINGS: Cardiovascular: Normal heart size.  No pericardial effusion. Mediastinum/Nodes: The thyroid gland is not visualized. The trachea appears patent and is midline. Normal appearance of the esophagus. No enlarged mediastinal or hilar lymph nodes. Lungs/Pleura: No pleural effusion. No airspace consolidation, atelectasis or pneumothorax. Scar like density is noted within the anterior right upper lobe, image 44/7. Upper Abdomen: No acute abnormality. Musculoskeletal: No chest wall abnormality. No acute or significant osseous findings. IMPRESSION: 1. No active cardiopulmonary abnormality. No complications from asthma.  2. Minimal scarring within the anterior right upper lobe. Electronically Signed   By: Signa Kell M.D.   On: 04/24/2018 11:42   Dg Chest Port 1 View  Result Date: 04/03/2018 CLINICAL DATA:  Wheezing and short of breath EXAM: PORTABLE CHEST 1 VIEW COMPARISON:  03/09/2017 FINDINGS: No acute consolidation or pleural effusion. Mild bronchitic changes. Normal heart size. No pneumothorax. IMPRESSION: No active disease.  Mild bronchitic changes Electronically Signed   By: Jasmine Pang M.D.   On: 04/03/2018 02:14    Microbiology: No results found for this or any previous visit (from the past 240 hour(s)).   Labs: Basic Metabolic Panel: Recent Labs  Lab 04/22/18 2339 04/23/18 0822 04/24/18 0430 04/25/18 0520  NA 139  --   141 142  K 3.3*  --  4.8 4.2  CL 107  --  111 109  CO2 25  --  25 26  GLUCOSE 124*  --  129* 82  BUN 16  --  15 22*  CREATININE 0.60 0.63 0.63 0.65  CALCIUM 9.1  --  9.6 9.5  MG 1.8  --  2.3  --    Liver Function Tests: No results for input(s): AST, ALT, ALKPHOS, BILITOT, PROT, ALBUMIN in the last 168 hours. No results for input(s): LIPASE, AMYLASE in the last 168 hours. No results for input(s): AMMONIA in the last 168 hours. CBC: Recent Labs  Lab 04/22/18 2339 04/23/18 0822 04/24/18 0430 04/25/18 0520  WBC 4.4 5.9 22.7* 10.4  NEUTROABS 2.3  --   --   --   HGB 9.7* 10.1* 10.3* 10.3*  HCT 28.6* 30.8* 31.6* 31.4*  MCV 97.6 95.4 99.7 99.7  PLT 301 224 361 337   Cardiac Enzymes: Recent Labs  Lab 04/22/18 2339  TROPONINI <0.03   BNP: BNP (last 3 results) No results for input(s): BNP in the last 8760 hours.  ProBNP (last 3 results) No results for input(s): PROBNP in the last 8760 hours.  CBG: No results for input(s): GLUCAP in the last 168 hours.     Signed:  Darlin Drop, MD [facility] 04/25/2018, 1:25 PM

## 2018-04-25 NOTE — Discharge Instructions (Signed)
Asthma, Adult °Asthma is a condition of the lungs in which the airways tighten and narrow. Asthma can make it hard to breathe. Asthma cannot be cured, but medicine and lifestyle changes can help control it. Asthma may be started (triggered) by: °· Animal skin flakes (dander). °· Dust. °· Cockroaches. °· Pollen. °· Mold. °· Smoke. °· Cleaning products. °· Hair sprays or aerosol sprays. °· Paint fumes or strong smells. °· Cold air, weather changes, and winds. °· Crying or laughing hard. °· Stress. °· Certain medicines or drugs. °· Foods, such as dried fruit, potato chips, and sparkling grape juice. °· Infections or conditions (colds, flu). °· Exercise. °· Certain medical conditions or diseases. °· Exercise or tiring activities. ° °Follow these instructions at home: °· Take medicine as told by your doctor. °· Use a peak flow meter as told by your doctor. A peak flow meter is a tool that measures how well the lungs are working. °· Record and keep track of the peak flow meter's readings. °· Understand and use the asthma action plan. An asthma action plan is a written plan for taking care of your asthma and treating your attacks. °· To help prevent asthma attacks: °? Do not smoke. Stay away from secondhand smoke. °? Change your heating and air conditioning filter often. °? Limit your use of fireplaces and wood stoves. °? Get rid of pests (such as roaches and mice) and their droppings. °? Throw away plants if you see mold on them. °? Clean your floors. Dust regularly. Use cleaning products that do not smell. °? Have someone vacuum when you are not home. Use a vacuum cleaner with a HEPA filter if possible. °? Replace carpet with wood, tile, or vinyl flooring. Carpet can trap animal skin flakes and dust. °? Use allergy-proof pillows, mattress covers, and box spring covers. °? Wash bed sheets and blankets every week in hot water and dry them in a dryer. °? Use blankets that are made of polyester or cotton. °? Clean bathrooms  and kitchens with bleach. If possible, have someone repaint the walls in these rooms with mold-resistant paint. Keep out of the rooms that are being cleaned and painted. °? Wash hands often. °Contact a doctor if: °· You have make a whistling sound when breaking (wheeze), have shortness of breath, or have a cough even if taking medicine to prevent attacks. °· The colored mucus you cough up (sputum) is thicker than usual. °· The colored mucus you cough up changes from clear or white to yellow, green, gray, or bloody. °· You have problems from the medicine you are taking such as: °? A rash. °? Itching. °? Swelling. °? Trouble breathing. °· You need reliever medicines more than 2-3 times a week. °· Your peak flow measurement is still at 50-79% of your personal best after following the action plan for 1 hour. °· You have a fever. °Get help right away if: °· You seem to be worse and are not responding to medicine during an asthma attack. °· You are short of breath even at rest. °· You get short of breath when doing very little activity. °· You have trouble eating, drinking, or talking. °· You have chest pain. °· You have a fast heartbeat. °· Your lips or fingernails start to turn blue. °· You are light-headed, dizzy, or faint. °· Your peak flow is less than 50% of your personal best. °This information is not intended to replace advice given to you by your health care provider. Make   sure you discuss any questions you have with your health care provider. °Document Released: 03/17/2008 Document Revised: 03/06/2016 Document Reviewed: 04/28/2013 °Elsevier Interactive Patient Education © 2017 Else[employer] Inc[employer] ° ° °Asthma Attack Prevention, Adult °Although you may not be able to control the fact that you have asthma, you can take actions to prevent episodes of asthma (asthma attacks). These actions include: °· Creating a written plan for managing and treating your asthma attacks (asthma action plan). °· Monitoring your  asthma. °· Avoiding things that can irritate your airways or make your asthma symptoms worse (asthma triggers). °· Taking your medicines as directed. °· Acting quickly if you have signs or symptoms of an asthma attack. ° °What are some ways to prevent an asthma attack? °Create a plan °Work with your health care provider to create an asthma action plan. This plan should include: °· A list of your asthma triggers and how to avoid them. °· A list of symptoms that you experience during an asthma attack. °· Information about when to take medicine and how much medicine to take. °· Information to help you understand your peak flow measurements. °· Contact information for your health care providers. °· Daily actions that you can take to control asthma. ° °Monitor your asthma ° °To monitor your asthma: °· Use your peak flow meter every morning and every evening for 2-3 weeks. Record the results in a journal. A drop in your peak flow numbers on one or more days may mean that you are starting to have an asthma attack, even if you are not having symptoms. °· When you have asthma symptoms, write them down in a journal. ° °Avoid asthma triggers ° °Work with your health care provider to find out what your asthma triggers are. This can be done by: °· Being tested for allergies. °· Keeping a journal that notes when asthma attacks occur and what may have contributed to them. °· Asking your health care provider whether other medical conditions make your asthma worse. ° °Common asthma triggers include: °· Dust. °· Smoke. This includes campfire smoke and secondhand smoke from tobacco products. °· Pet dander. °· Trees, grasses or pollens. °· Very cold, dry, or humid air. °· Mold. °· Foods that contain high amounts of sulfites. °· Strong smells. °· Engine exhaust and air pollution. °· Aerosol sprays and fumes from household cleaners. °· Household pests and their droppings, including dust mites and cockroaches. °· Certain medicines,  including NSAIDs. ° °Once you have determined your asthma triggers, take steps to avoid them. Depending on your triggers, you may be able to reduce the chance of an asthma attack by: °· Keeping your home clean. Have someone dust and vacuum your home for you 1 or 2 times a week. If possible, have them use a high-efficiency particulate arrestance (HEPA) vacuum. °· Washing your sheets weekly in hot water. °· Using allergy-proof mattress covers and casings on your bed. °· Keeping pets out of your home. °· Taking care of mold and water problems in your home. °· Avoiding areas where people smoke. °· Avoiding using strong perfumes or odor sprays. °· Avoid spending a lot of time outdoors when pollen counts are high and on very windy days. °· Talking with your health care provider before stopping or starting any new medicines. ° °Medicines °Take over-the-counter and prescription medicines only as told by your health care provider. Many asthma attacks can be prevented by carefully following your medicine schedule. Taking your medicines correctly is especially important when you   cannot avoid certain asthma triggers. Even if you are doing well, do not stop taking your medicine and do not take less medicine. Act quickly If an asthma attack happens, acting quickly can decrease how severe it is and how long it lasts. Take these actions:  Pay attention to your symptoms. If you are coughing, wheezing, or having difficulty breathing, do not wait to see if your symptoms go away on their own. Follow your asthma action plan.  If you have followed your asthma action plan and your symptoms are not improving, call your health care provider or seek immediate medical care at the nearest hospital.  It is important to write down how often you need to use your fast-acting rescue inhaler. You can track how often you use an inhaler in your journal. If you are using your rescue inhaler more often, it may mean that your asthma is not under  control. Adjusting your asthma treatment plan may help you to prevent future asthma attacks and help you to gain better control of your condition. How can I prevent an asthma attack when I exercise?  Exercise is a common asthma trigger. To prevent asthma attacks during exercise:  Follow advice from your health care provider about whether you should use your fast-acting inhaler before exercising. Many people with asthma experience exercise-induced bronchoconstriction (EIB). This condition often worsens during vigorous exercise in cold, humid, or dry environments. Usually, people with EIB can stay very active by using a fast-acting inhaler before exercising.  Avoid exercising outdoors in very cold or humid weather.  Avoid exercising outdoors when pollen counts are high.  Warm up and cool down when exercising.  Stop exercising right away if asthma symptoms start.  Consider taking part in exercises that are less likely to cause asthma symptoms such as:  Indoor swimming.  Biking.  Walking.  Hiking.  Playing football.  This information is not intended to replace advice given to you by your health care provider. Make sure you discuss any questions you have with your health care provider. Document Released: 09/17/2009 Document Revised: 05/30/2016 Document Reviewed: 03/15/2016 Elsevier Interactive Patient Education  2018 [employer]   Chronic Obstructive Pulmonary Disease Exacerbation Chronic obstructive pulmonary disease (COPD) is a common lung problem. In COPD, the flow of air from the lungs is limited. COPD exacerbations are times that breathing gets worse and you need extra treatment. Without treatment they can be life threatening. If they happen often, your lungs can become more damaged. If your COPD gets worse, your doctor may treat you with:  Medicines.  Oxygen.  Different ways to clear your airway, such as using a mask.  Follow these instructions at home:  Do not  smoke.  Avoid tobacco smoke and other things that bother your lungs.  If given, take your antibiotic medicine as told. Finish the medicine even if you start to feel better.  Only take medicines as told by your doctor.  Drink enough fluids to keep your pee (urine) clear or pale yellow (unless your doctor has told you not to).  Use a cool mist machine (vaporizer).  If you use oxygen or a machine that turns liquid medicine into a mist (nebulizer), continue to use them as told.  Keep up with shots (vaccinations) as told by your doctor.  Exercise regularly.  Eat healthy foods.  Keep all doctor visits as told. Get help right away if:  You are very short of breath and it gets worse.  You have trouble talking.  You have bad chest pain.  You have blood in your spit (sputum).  You have a fever.  You keep throwing up (vomiting).  You feel weak, or you pass out (faint).  You feel confused.  You keep getting worse. This information is not intended to replace advice given to you by your health care provider. Make sure you discuss any questions you have with your health care provider. Document Released: 09/18/2011 Document Revised: 03/06/2016 Document Reviewed: 06/03/2013 Elsevier Interactive Patient Education  2017 [employer] Inc.   How to Use a Nebulizer, Adult A nebulizer is a device that turns liquid medicine into a mist (vapor) that you can breathe in (inhale). You may need to use a nebulizer if you have a breathing illness, such as asthma or pneumonia. There are different kinds of nebulizers. With some, you breathe in through a mouthpiece. With others, a mask fits over your nose and mouth. Risks and complications Using a nebulizer that does not fit right or is not cleaned right can lead to the following complications:  Infection.  Eye irritation.  Delivery of too much medicine or not enough medicine.  Mouth irritation.  How to prepare before using a nebulizer Take  these steps before using your nebulizer: 1. Check your medicine. Make sure it has not expired and is not damaged in any way. 2. Wash your hands with soap and water. 3. Put all of the parts of your nebulizer on a sturdy, flat surface. Make sure all of the tubing is connected. 4. Measure the liquid medicine according to instructions from your health care provider. Pour the liquid into the part of the nebulizer that holds the medicine (reservoir). 5. Attach the mouthpiece or mask. 6. Test the nebulizer by turning it on to make sure that a spray comes out. Then, turn it off.  How to use a nebulizer 1. Sit down and relax. 2. If your nebulizer has a mask, put it over your nose and mouth. It should fit somewhat snugly, with no gaps around the nose or cheeks where medicine could escape. If you use a mouthpiece, put it in your mouth. Press your lips firmly around the mouthpiece. 3. Turn on the nebulizer. 4. Breathe out (exhale). 5. Some nebulizers have a finger valve. If yours does, cover up the air hole so the air gets to the nebulizer. 6. Once the medicine begins to mist out, take slow, deep breaths. If there is a finger valve, release it at the end of your breath. 7. Continue taking slow, deep breaths until the medicine in the nebulizer is gone and no vapor appears. Be sure to stop the machine at any time if you start coughing or if the medicine foams or bubbles. How to clean a nebulizer The nebulizer and all of its parts must be kept very clean. If the nebulizer and its parts are not cleaned properly, bacteria can grow inside of them. If you inhale the bacteria, you can get sick. Follow the manufacturer's instructions for cleaning your nebulizer. For most nebulizers, you should follow these guidelines:  Wash the nebulizer after each use. Use warm water and soap. Make sure to wash the mouthpiece or mask and the medicine area, but do not wash the tubing or mouthpiece.  After you wash the nebulizer,  place its parts on a clean towel and let them dry completely. After they dry, reconnect the pieces and turn the nebulizer on without any medicine in it. Doing this will blow air through the equipment  to help dry it out.  Store the nebulizer in a clean and dust-free place.  Check the filter at least one time every week. Replace it if it looks dirty.  Contact a health care provider if:  You continue to have trouble breathing.  You have trouble using the nebulizer.  Your breathing gets worse during a nebulizer treatment.  Your nebulizer stops working, foams, or does not create a mist after you add medicine and turn it on. This information is not intended to replace advice given to you by your health care provider. Make sure you discuss any questions you have with your health care provider. Document Released: 09/17/2009 Document Revised: 05/29/2016 Document Reviewed: 04/05/2016 Elsevier Interactive Patient Education  [employer].

## 2018-10-02 ENCOUNTER — Emergency Department (HOSPITAL_BASED_OUTPATIENT_CLINIC_OR_DEPARTMENT_OTHER): Payer: BLUE CROSS/BLUE SHIELD

## 2018-10-02 ENCOUNTER — Other Ambulatory Visit: Payer: Self-pay

## 2018-10-02 ENCOUNTER — Emergency Department (HOSPITAL_BASED_OUTPATIENT_CLINIC_OR_DEPARTMENT_OTHER)
Admission: EM | Admit: 2018-10-02 | Discharge: 2018-10-03 | Disposition: A | Discharge status: Home / Self Care | Payer: BLUE CROSS/BLUE SHIELD | Attending: Emergency Medicine | Admitting: Emergency Medicine

## 2018-10-02 ENCOUNTER — Encounter (HOSPITAL_BASED_OUTPATIENT_CLINIC_OR_DEPARTMENT_OTHER): Payer: Self-pay | Admitting: *Deleted

## 2018-10-02 DIAGNOSIS — E039 Hypothyroidism, unspecified: Secondary | ICD-10-CM | POA: Insufficient documentation

## 2018-10-02 DIAGNOSIS — J449 Chronic obstructive pulmonary disease, unspecified: Secondary | ICD-10-CM | POA: Diagnosis not present

## 2018-10-02 DIAGNOSIS — R0602 Shortness of breath: Secondary | ICD-10-CM | POA: Diagnosis present

## 2018-10-02 DIAGNOSIS — J209 Acute bronchitis, unspecified: Secondary | ICD-10-CM | POA: Diagnosis not present

## 2018-10-02 DIAGNOSIS — J4521 Mild intermittent asthma with (acute) exacerbation: Secondary | ICD-10-CM | POA: Diagnosis not present

## 2018-10-02 DIAGNOSIS — Z79899 Other long term (current) drug therapy: Secondary | ICD-10-CM | POA: Diagnosis not present

## 2018-10-02 MED ORDER — ALBUTEROL SULFATE (2.5 MG/3ML) 0.083% IN NEBU
5.0000 mg | INHALATION_SOLUTION | Freq: Once | RESPIRATORY_TRACT | Status: AC
  Administered 2018-10-02: 5 mg via RESPIRATORY_TRACT
  Filled 2018-10-02: qty 6

## 2018-10-02 MED ORDER — HYDROCODONE-ACETAMINOPHEN 5-325 MG PO TABS
1.0000 | ORAL_TABLET | Freq: Once | ORAL | Status: AC
  Administered 2018-10-02: 1 via ORAL
  Filled 2018-10-02: qty 1

## 2018-10-02 MED ORDER — DOXYCYCLINE HYCLATE 100 MG PO TABS
100.0000 mg | ORAL_TABLET | Freq: Once | ORAL | Status: AC
  Administered 2018-10-02: 100 mg via ORAL
  Filled 2018-10-02: qty 1

## 2018-10-02 MED ORDER — HYDROCODONE-ACETAMINOPHEN 5-325 MG PO TABS: 1.0000 | ORAL_TABLET | ORAL | 0 refills | Status: DC | PRN

## 2018-10-02 MED ORDER — DEXAMETHASONE SODIUM PHOSPHATE 10 MG/ML IJ SOLN
10.0000 mg | Freq: Once | INTRAMUSCULAR | Status: AC
  Administered 2018-10-02: 10 mg via INTRAMUSCULAR
  Filled 2018-10-02: qty 1

## 2018-10-02 MED ORDER — PREDNISONE 10 MG (21) PO TBPK: ORAL_TABLET | ORAL | 0 refills | Status: DC

## 2018-10-02 MED ORDER — ALBUTEROL SULFATE (2.5 MG/3ML) 0.083% IN NEBU
2.5000 mg | INHALATION_SOLUTION | Freq: Once | RESPIRATORY_TRACT | Status: AC
  Administered 2018-10-02: 2.5 mg via RESPIRATORY_TRACT
  Filled 2018-10-02: qty 3

## 2018-10-02 MED ORDER — DOXYCYCLINE HYCLATE 100 MG PO CAPS: 100.0000 mg | ORAL_CAPSULE | Freq: Two times a day (BID) | ORAL | 0 refills | Status: DC

## 2018-10-02 MED ORDER — BUDESONIDE-FORMOTEROL FUMARATE 80-4.5 MCG/ACT IN AERO: 2.0000 | INHALATION_SPRAY | Freq: Two times a day (BID) | RESPIRATORY_TRACT | 0 refills | Status: DC

## 2018-10-02 MED ORDER — IPRATROPIUM-ALBUTEROL 0.5-2.5 (3) MG/3ML IN SOLN
3.0000 mL | Freq: Once | RESPIRATORY_TRACT | Status: AC
  Administered 2018-10-02: 3 mL via RESPIRATORY_TRACT
  Filled 2018-10-02: qty 3

## 2018-10-02 NOTE — ED Notes (Signed)
Breathing tx, resp to call when ready for xray

## 2018-10-02 NOTE — Discharge Instructions (Signed)
Stop Breo and start Symbicort.

## 2018-10-02 NOTE — ED Provider Notes (Signed)
[facility] EMERGENCY DEPARTMENT Provider Note   CSN: [id number] Arrival date & time: 10/02/18  2216     History   Chief Complaint Chief Complaint  Patient presents with  . Asthma    HPI Maria LawmanBernita Powell is a 52 y.o. female.  Pt presents to the ED today with SOB.  The pt said she has a hx of asthma and has been sob for 2 days.  The pt has been using her nebulizers without relief of sx.  She has not had any f/c.  She was last on steroids several months ago.      Past Medical History:  Diagnosis Date  . Asthma   . Bronchitis   . COPD (chronic obstructive pulmonary disease) (HCC)   . Grave's disease   . Hypothyroidism   . Shortness of breath 10/22/11   "all the time right now"    Patient Active Problem List   Diagnosis Date Noted  . Acute respiratory failure with hypoxia (HCC) 04/23/2018  . COPD exacerbation (HCC) 04/03/2018  . Acute respiratory failure (HCC) 06/15/2016  . Abnormal ECG 06/15/2016  . Acute hyperglycemia 06/15/2016  . Hypothyroidism 06/04/2016  . Hypokalemia 06/04/2016  . Acute asthmatic bronchitis 10/22/2011  . Thyroid disease 10/22/2011  . Grave's disease     Past Surgical History:  Procedure Laterality Date  . ABDOMINAL HYSTERECTOMY  11/2007  . ANKLE SURGERY Left   . APPENDECTOMY  11/2007  . CESAREAN SECTION  1986; 1987; 1993; 1997     OB History   No obstetric history on file.      Home Medications    Prior to Admission medications   Medication Sig Start Date End Date Taking? Authorizing Provider  albuterol (PROVENTIL HFA;VENTOLIN HFA) 108 (90 BASE) MCG/ACT inhaler Inhale 2 puffs into the lungs every 4 (four) hours as needed for wheezing or shortness of breath. 06/14/15   Horton, Mayer Maskerourtney F, MD  budesonide-formoterol (SYMBICORT) 80-4.5 MCG/ACT inhaler Inhale 2 puffs into the lungs 2 (two) times daily. 10/02/18   Jacalyn LefevreHaviland, Yamilee Harmes, MD  doxycycline (VIBRAMYCIN) 100 MG capsule Take 1 capsule (100 mg total) by mouth 2 (two) times  daily. 10/02/18   Jacalyn LefevreHaviland, Chassity Ludke, MD  fluticasone (FLONASE) 50 MCG/ACT nasal spray Place 1-2 sprays into both nostrils daily. 04/07/18   Narda BondsNettey, Ralph A, MD  HYDROcodone-acetaminophen (NORCO/VICODIN) 5-325 MG tablet Take 1 tablet by mouth every 4 (four) hours as needed. 10/02/18   Jacalyn LefevreHaviland, Jacorey Donaway, MD  ipratropium-albuterol (DUONEB) 0.5-2.5 (3) MG/3ML SOLN Take 3 mLs by nebulization every 6 (six) hours as needed. 04/25/18   Darlin DropHall, Carole N, DO  levothyroxine (SYNTHROID, LEVOTHROID) 175 MCG tablet Take 175 mcg by mouth daily.     [provider]  montelukast (SINGULAIR) 10 MG tablet Take 1 tablet (10 mg total) by mouth at bedtime. 04/25/18   Darlin DropHall, Carole N, DO  predniSONE (STERAPRED UNI-PAK 21 TAB) 10 MG (21) TBPK tablet Take 6 tabs for 2 days, then 5 for 2 days, then 4 for 2 days, then 3 for 2 days, 2 for 2 days, then 1 for 2 days 10/02/18   Jacalyn LefevreHaviland, Tyrese Ficek, MD    Family History Family History  Problem Relation Age of Onset  . Sarcoidosis Mother     Social History Social History   Tobacco Use  . Smoking status: Never Smoker  . Smokeless tobacco: Never Used  Substance Use Topics  . Alcohol use: No  . Drug use: No     Allergies   Patient has no known  allergies.   Review of Systems Review of Systems  Respiratory: Positive for cough, shortness of breath and wheezing.   All other systems reviewed and are negative.    Physical Exam Updated Vital Signs BP 130/85   Pulse 95   Temp 98 F (36.7 C)   Resp 18   Ht 5\' 6"  (1.676 m)   Wt 90.7 kg   SpO2 96%   BMI 32.28 kg/m   Physical Exam Vitals signs and nursing note reviewed.  Constitutional:      Appearance: Normal appearance. She is obese.  HENT:     Head: Normocephalic and atraumatic.     Right Ear: External ear normal.     Left Ear: External ear normal.     Nose: Nose normal.     Mouth/Throat:     Mouth: Mucous membranes are moist.     Pharynx: Oropharynx is clear.  Eyes:     Extraocular Movements:  Extraocular movements intact.     Conjunctiva/sclera: Conjunctivae normal.     Pupils: Pupils are equal, round, and reactive to light.  Neck:     Musculoskeletal: Normal range of motion.  Cardiovascular:     Rate and Rhythm: Normal rate and regular rhythm.     Pulses: Normal pulses.     Heart sounds: Normal heart sounds.  Pulmonary:     Effort: Tachypnea present.     Breath sounds: Wheezing present.  Abdominal:     General: Abdomen is flat. Bowel sounds are normal.  Musculoskeletal: Normal range of motion.  Skin:    General: Skin is warm and dry.     Capillary Refill: Capillary refill takes less than 2 seconds.  Neurological:     General: No focal deficit present.     Mental Status: She is alert and oriented to person, place, and time.  Psychiatric:        Mood and Affect: Mood normal.        Behavior: Behavior normal.      ED Treatments / Results  Labs (all labs ordered are listed, but only abnormal results are displayed) Labs Reviewed - No data to display  EKG None  Radiology Dg Chest 2 View  Result Date: 10/02/2018 CLINICAL DATA:  Acute onset of cough and congestion. EXAM: CHEST - 2 VIEW COMPARISON:  Chest radiograph performed 04/22/2018, and CT of the chest performed 04/24/2018 FINDINGS: The lungs are well-aerated. Mild vascular congestion is noted. There is no evidence of focal opacification, pleural effusion or pneumothorax. The heart is normal in size; the mediastinal contour is within normal limits. No acute osseous abnormalities are seen. IMPRESSION: Mild vascular congestion noted; lungs remain grossly clear. Electronically Signed   By: Roanna RaiderJeffery  Chang M.D.   On: 10/02/2018 22:57    Procedures Procedures (including critical care time)  Medications Ordered in ED Medications  ipratropium-albuterol (DUONEB) 0.5-2.5 (3) MG/3ML nebulizer solution 3 mL (3 mLs Nebulization Given 10/02/18 2229)  albuterol (PROVENTIL) (2.5 MG/3ML) 0.083% nebulizer solution 2.5 mg (2.5  mg Nebulization Given 10/02/18 2229)  dexamethasone (DECADRON) injection 10 mg (10 mg Intramuscular Given 10/02/18 2237)  doxycycline (VIBRA-TABS) tablet 100 mg (100 mg Oral Given 10/02/18 2331)  albuterol (PROVENTIL) (2.5 MG/3ML) 0.083% nebulizer solution 5 mg (5 mg Nebulization Given 10/02/18 2309)  HYDROcodone-acetaminophen (NORCO/VICODIN) 5-325 MG per tablet 1 tablet (1 tablet Oral Given 10/02/18 2338)     Initial Impression / Assessment and Plan / ED Course  I have reviewed the triage vital signs and the nursing notes.  Pertinent labs & imaging results that were available during my care of the patient were reviewed by me and considered in my medical decision making (see chart for details).    Pt is feeling much better after nebs.  She was given 10 mg decadron IM and a total of 10 mg albuterol.  She is still wheezing, but she said she always wheezes.  She will be changed from [facility] to Symbicort.  She has bronchitis also, so will be started on doxy.  She will be d/c home on prednisone.  She is c/o chest wall pain from coughing, so she will also go home with Lortab.  She has an appt with a new pulmonologist next week.  She knows to return if worse.  Final Clinical Impressions(s) / ED Diagnoses   Final diagnoses:  Mild intermittent asthma with exacerbation  Acute bronchitis, unspecified organism    ED Discharge Orders         Ordered    budesonide-formoterol (SYMBICORT) 80-4.5 MCG/ACT inhaler  2 times daily     10/02/18 2335    predniSONE (STERAPRED UNI-PAK 21 TAB) 10 MG (21) TBPK tablet     10/02/18 2335    doxycycline (VIBRAMYCIN) 100 MG capsule  2 times daily     10/02/18 2335    HYDROcodone-acetaminophen (NORCO/VICODIN) 5-325 MG tablet  Every 4 hours PRN     10/02/18 2336           Jacalyn Lefevre, MD 10/02/18 2347

## 2018-10-02 NOTE — ED Notes (Signed)
Patient transported to X-ray 

## 2018-10-02 NOTE — ED Triage Notes (Signed)
Increased SOB x 2 days. Using home nebulizer without relief. Hx of asthma. Taken to room 6 for triage. RT and EDP in to assess

## 2018-10-31 ENCOUNTER — Emergency Department (HOSPITAL_BASED_OUTPATIENT_CLINIC_OR_DEPARTMENT_OTHER): Payer: BLUE CROSS/BLUE SHIELD

## 2018-10-31 ENCOUNTER — Other Ambulatory Visit: Payer: Self-pay

## 2018-10-31 ENCOUNTER — Emergency Department (HOSPITAL_BASED_OUTPATIENT_CLINIC_OR_DEPARTMENT_OTHER)
Admission: EM | Admit: 2018-10-31 | Discharge: 2018-10-31 | Disposition: A | Discharge status: Home / Self Care | Payer: BLUE CROSS/BLUE SHIELD | Attending: Emergency Medicine | Admitting: Emergency Medicine

## 2018-10-31 ENCOUNTER — Encounter (HOSPITAL_BASED_OUTPATIENT_CLINIC_OR_DEPARTMENT_OTHER): Payer: Self-pay | Admitting: *Deleted

## 2018-10-31 DIAGNOSIS — R0602 Shortness of breath: Secondary | ICD-10-CM | POA: Diagnosis not present

## 2018-10-31 DIAGNOSIS — R0789 Other chest pain: Secondary | ICD-10-CM | POA: Diagnosis not present

## 2018-10-31 DIAGNOSIS — J441 Chronic obstructive pulmonary disease with (acute) exacerbation: Secondary | ICD-10-CM | POA: Diagnosis not present

## 2018-10-31 DIAGNOSIS — R062 Wheezing: Secondary | ICD-10-CM | POA: Diagnosis present

## 2018-10-31 DIAGNOSIS — E039 Hypothyroidism, unspecified: Secondary | ICD-10-CM | POA: Insufficient documentation

## 2018-10-31 DIAGNOSIS — Z79899 Other long term (current) drug therapy: Secondary | ICD-10-CM | POA: Insufficient documentation

## 2018-10-31 MED ORDER — ALBUTEROL SULFATE (2.5 MG/3ML) 0.083% IN NEBU
2.5000 mg | INHALATION_SOLUTION | Freq: Once | RESPIRATORY_TRACT | Status: AC
  Administered 2018-10-31: 2.5 mg via RESPIRATORY_TRACT

## 2018-10-31 MED ORDER — IPRATROPIUM-ALBUTEROL 0.5-2.5 (3) MG/3ML IN SOLN
3.0000 mL | Freq: Once | RESPIRATORY_TRACT | Status: AC
  Administered 2018-10-31: 3 mL via RESPIRATORY_TRACT

## 2018-10-31 MED ORDER — IPRATROPIUM-ALBUTEROL 0.5-2.5 (3) MG/3ML IN SOLN
RESPIRATORY_TRACT | Status: AC
  Administered 2018-10-31: 3 mL via RESPIRATORY_TRACT
  Filled 2018-10-31: qty 3

## 2018-10-31 MED ORDER — PREDNISONE 20 MG PO TABS: ORAL_TABLET | ORAL | 0 refills | Status: DC

## 2018-10-31 MED ORDER — ALBUTEROL SULFATE (2.5 MG/3ML) 0.083% IN NEBU
INHALATION_SOLUTION | RESPIRATORY_TRACT | Status: AC
  Administered 2018-10-31: 2.5 mg via RESPIRATORY_TRACT
  Filled 2018-10-31: qty 3

## 2018-10-31 MED ORDER — ALBUTEROL SULFATE (2.5 MG/3ML) 0.083% IN NEBU
INHALATION_SOLUTION | RESPIRATORY_TRACT | Status: AC
  Administered 2018-10-31: 2.5 mg
  Filled 2018-10-31: qty 3

## 2018-10-31 MED ORDER — IPRATROPIUM-ALBUTEROL 0.5-2.5 (3) MG/3ML IN SOLN
RESPIRATORY_TRACT | Status: AC
  Administered 2018-10-31: 3 mL
  Filled 2018-10-31: qty 3

## 2018-10-31 MED ORDER — DEXAMETHASONE SODIUM PHOSPHATE 10 MG/ML IJ SOLN
10.0000 mg | Freq: Once | INTRAMUSCULAR | Status: AC
  Administered 2018-10-31: 10 mg via INTRAMUSCULAR
  Filled 2018-10-31: qty 1

## 2018-10-31 NOTE — ED Triage Notes (Signed)
Pt reports hx of asthma. SOB x 2 days. Using home inhaler and nebs without relief. BBS wheezing

## 2018-10-31 NOTE — ED Provider Notes (Signed)
[facility] EMERGENCY DEPARTMENT Provider Note   CSN: [id number] Arrival date & time: 10/31/18  1726     History   Chief Complaint Chief Complaint  Patient presents with  . Asthma    HPI Wynonia LawmanBernita Powell is a 53 y.o. female.  Patient is a 53 year old female with a history of COPD who presents with wheezing and shortness of breath.  She states over the last couple days she has had worsening wheezing and shortness of breath.  She has been using her inhalers at home as well as her nebulizer machines without significant improvement in symptoms.  She was seen here in December for similar symptoms and states he got better but now got worse again.  She has an upcoming appointment with the pulmonologist but not until next month.  She denies any fevers.  She has a little pain in the center of her chest but only with coughing.  No significant change in her sputum.  No leg swelling.     Past Medical History:  Diagnosis Date  . Asthma   . Bronchitis   . COPD (chronic obstructive pulmonary disease) (HCC)   . Grave's disease   . Hypothyroidism   . Shortness of breath 10/22/11   "all the time right now"    Patient Active Problem List   Diagnosis Date Noted  . Acute respiratory failure with hypoxia (HCC) 04/23/2018  . COPD exacerbation (HCC) 04/03/2018  . Acute respiratory failure (HCC) 06/15/2016  . Abnormal ECG 06/15/2016  . Acute hyperglycemia 06/15/2016  . Hypothyroidism 06/04/2016  . Hypokalemia 06/04/2016  . Acute asthmatic bronchitis 10/22/2011  . Thyroid disease 10/22/2011  . Grave's disease     Past Surgical History:  Procedure Laterality Date  . ABDOMINAL HYSTERECTOMY  11/2007  . ANKLE SURGERY Left   . APPENDECTOMY  11/2007  . CESAREAN SECTION  1986; 1987; 1993; 1997     OB History   No obstetric history on file.      Home Medications    Prior to Admission medications   Medication Sig Start Date End Date Taking? Authorizing Provider  albuterol  (PROVENTIL HFA;VENTOLIN HFA) 108 (90 BASE) MCG/ACT inhaler Inhale 2 puffs into the lungs every 4 (four) hours as needed for wheezing or shortness of breath. 06/14/15   Horton, Mayer Maskerourtney F, MD  budesonide-formoterol (SYMBICORT) 80-4.5 MCG/ACT inhaler Inhale 2 puffs into the lungs 2 (two) times daily. 10/02/18   Jacalyn LefevreHaviland, Julie, MD  doxycycline (VIBRAMYCIN) 100 MG capsule Take 1 capsule (100 mg total) by mouth 2 (two) times daily. 10/02/18   Jacalyn LefevreHaviland, Julie, MD  fluticasone (FLONASE) 50 MCG/ACT nasal spray Place 1-2 sprays into both nostrils daily. 04/07/18   Narda BondsNettey, Ralph A, MD  HYDROcodone-acetaminophen (NORCO/VICODIN) 5-325 MG tablet Take 1 tablet by mouth every 4 (four) hours as needed. 10/02/18   Jacalyn LefevreHaviland, Julie, MD  ipratropium-albuterol (DUONEB) 0.5-2.5 (3) MG/3ML SOLN Take 3 mLs by nebulization every 6 (six) hours as needed. 04/25/18   Darlin DropHall, Carole N, DO  levothyroxine (SYNTHROID, LEVOTHROID) 175 MCG tablet Take 175 mcg by mouth daily.     [provider]  montelukast (SINGULAIR) 10 MG tablet Take 1 tablet (10 mg total) by mouth at bedtime. 04/25/18   Darlin DropHall, Carole N, DO  predniSONE (DELTASONE) 20 MG tablet 2 tabs po daily x 4 days 10/31/18   Rolan BuccoBelfi, Shaine Mount, MD    Family History Family History  Problem Relation Age of Onset  . Sarcoidosis Mother     Social History Social History  Tobacco Use  . Smoking status: Never Smoker  . Smokeless tobacco: Never Used  Substance Use Topics  . Alcohol use: No  . Drug use: No     Allergies   Patient has no known allergies.   Review of Systems Review of Systems  Constitutional: Negative for chills, diaphoresis, fatigue and fever.  HENT: Negative for congestion, rhinorrhea and sneezing.   Eyes: Negative.   Respiratory: Positive for cough, shortness of breath and wheezing. Negative for chest tightness.   Cardiovascular: Positive for chest pain. Negative for leg swelling.  Gastrointestinal: Negative for abdominal pain, blood in stool,  diarrhea, nausea and vomiting.  Genitourinary: Negative for difficulty urinating, flank pain, frequency and hematuria.  Musculoskeletal: Negative for arthralgias and back pain.  Skin: Negative for rash.  Neurological: Negative for dizziness, speech difficulty, weakness, numbness and headaches.     Physical Exam Updated Vital Signs BP 131/81   Pulse 80   Temp 98.4 F (36.9 C) (Oral)   Resp (!) 21   Ht 5\' 6"  (1.676 m)   Wt 90.7 kg   SpO2 100%   BMI 32.28 kg/m   Physical Exam Constitutional:      Appearance: She is well-developed.  HENT:     Head: Normocephalic and atraumatic.  Eyes:     Pupils: Pupils are equal, round, and reactive to light.  Neck:     Musculoskeletal: Normal range of motion and neck supple.  Cardiovascular:     Rate and Rhythm: Normal rate and regular rhythm.     Heart sounds: Normal heart sounds.  Pulmonary:     Effort: Pulmonary effort is normal. No respiratory distress.     Breath sounds: Wheezing present. No rales.  Chest:     Chest wall: No tenderness.  Abdominal:     General: Bowel sounds are normal.     Palpations: Abdomen is soft.     Tenderness: There is no abdominal tenderness. There is no guarding or rebound.  Musculoskeletal: Normal range of motion.        General: No tenderness.     Right lower leg: No edema.     Left lower leg: No edema.  Lymphadenopathy:     Cervical: No cervical adenopathy.  Skin:    General: Skin is warm and dry.     Findings: No rash.  Neurological:     Mental Status: She is alert and oriented to person, place, and time.      ED Treatments / Results  Labs (all labs ordered are listed, but only abnormal results are displayed) Labs Reviewed - No data to display  EKG EKG Interpretation  Date/Time:  Sunday October 31 2018 18:29:17 EST Ventricular Rate:  95 PR Interval:    QRS Duration: 83 QT Interval:  349 QTC Calculation: 439 R Axis:   69 Text Interpretation:  Sinus rhythm Borderline repolarization  abnormality since last tracing no significant change Confirmed by Rolan Bucco [phone]) on 10/31/2018 6:36:06 PM   Radiology Dg Chest 2 View  Result Date: 10/31/2018 CLINICAL DATA:  Cough and congestion EXAM: CHEST - 2 VIEW COMPARISON:  None. FINDINGS: October 02, 2017 IMPRESSION: No active cardiopulmonary disease. Electronically Signed   By: Gerome Sam III M.D   On: 10/31/2018 19:16    Procedures Procedures (including critical care time)  Medications Ordered in ED Medications  albuterol (PROVENTIL) (2.5 MG/3ML) 0.083% nebulizer solution 2.5 mg (2.5 mg Nebulization Given 10/31/18 1739)  ipratropium-albuterol (DUONEB) 0.5-2.5 (3) MG/3ML nebulizer solution 3 mL (3 mLs  Nebulization Given 10/31/18 1739)  dexamethasone (DECADRON) injection 10 mg (10 mg Intramuscular Given 10/31/18 1803)  albuterol (PROVENTIL) (2.5 MG/3ML) 0.083% nebulizer solution (2.5 mg  Given 10/31/18 1806)  ipratropium-albuterol (DUONEB) 0.5-2.5 (3) MG/3ML nebulizer solution (3 mLs  Given 10/31/18 1807)     Initial Impression / Assessment and Plan / ED Course  I have reviewed the triage vital signs and the nursing notes.  Pertinent labs & imaging results that were available during my care of the patient were reviewed by me and considered in my medical decision making (see chart for details).     Patient is a 53 year old female who presents with wheezing and shortness of breath.  Chest x-ray shows no evidence of pneumonia.  Her breathing has markedly improved after nebulizer treatments and Decadron in the ED.  Her lung sounds are markedly improved.  She has no hypoxia.  She feels like she is back to baseline and is ready to go home.  She was discharged home in good condition.  She was started on a prednisone burst.  She has an upcoming appointment with pulmonology this week.  Return precautions were given.  Final Clinical Impressions(s) / ED Diagnoses   Final diagnoses:  COPD exacerbation [facility])    ED Discharge  Orders         Ordered    predniSONE (DELTASONE) 20 MG tablet     10/31/18 2052           Rolan Bucco, MD 10/31/18 2053

## 2020-01-09 ENCOUNTER — Other Ambulatory Visit: Payer: Self-pay

## 2020-01-09 ENCOUNTER — Emergency Department (HOSPITAL_BASED_OUTPATIENT_CLINIC_OR_DEPARTMENT_OTHER)
Admission: EM | Admit: 2020-01-09 | Discharge: 2020-01-10 | Disposition: A | Discharge status: Home / Self Care | Payer: 59 | Attending: Emergency Medicine | Admitting: Emergency Medicine

## 2020-01-09 ENCOUNTER — Encounter (HOSPITAL_BASED_OUTPATIENT_CLINIC_OR_DEPARTMENT_OTHER): Payer: Self-pay

## 2020-01-09 DIAGNOSIS — E039 Hypothyroidism, unspecified: Secondary | ICD-10-CM | POA: Diagnosis not present

## 2020-01-09 DIAGNOSIS — J029 Acute pharyngitis, unspecified: Secondary | ICD-10-CM | POA: Diagnosis not present

## 2020-01-09 DIAGNOSIS — F17210 Nicotine dependence, cigarettes, uncomplicated: Secondary | ICD-10-CM | POA: Insufficient documentation

## 2020-01-09 NOTE — ED Triage Notes (Signed)
Had 2nd covid vaccine on Friday, has knot on left arm from injection and now c/o ringing in left ear and sore throat. NAD.

## 2020-01-10 LAB — GROUP A STREP BY PCR: Group A Strep by PCR: NOT DETECTED

## 2020-01-10 NOTE — ED Provider Notes (Signed)
[facility] Provider Note: Georgena Spurling, MD, FACEP  CSN: [id number] MRN: [id number] ARRIVAL: 01/09/20 at [place]: [place]  Sore Throat   HISTORY OF PRESENT ILLNESS  01/10/20 12:35 AM Maria Everett is a 54 y.o. female who received her second Covid vaccination 4 days ago.  She has a knot on her left arm at the injection site.  She is here now complaining of a sore throat and ringing in her left ear.  She rates the pain in her throat as a 9 out of 10, worse with swallowing.  She denies a fever.   Past Medical History:  Diagnosis Date  . Asthma   . Bronchitis   . COPD (chronic obstructive pulmonary disease) ([place])   . Grave's disease   . Hypothyroidism   . Shortness of breath 10/22/11   "all the time right now"    Past Surgical History:  Procedure Laterality Date  . ABDOMINAL HYSTERECTOMY  11/2007  . ANKLE SURGERY Left   . APPENDECTOMY  11/2007  . [place]; 1987; 1993; 1997    Family History  Problem Relation Age of Onset  . Sarcoidosis Mother     Social History   Tobacco Use  . Smoking status: Never Smoker  . Smokeless tobacco: Never Used  Substance Use Topics  . Alcohol use: No  . Drug use: No    Prior to Admission medications   Medication Sig Start Date End Date Taking? Authorizing Provider  albuterol (PROVENTIL HFA;VENTOLIN HFA) 108 (90 BASE) MCG/ACT inhaler Inhale 2 puffs into the lungs every 4 (four) hours as needed for wheezing or shortness of breath. 06/14/15   Horton, Barbette Hair, MD  fluticasone (FLONASE) 50 MCG/ACT nasal spray Place 1-2 sprays into both nostrils daily. 04/07/18   Mariel Aloe, MD  ipratropium-albuterol (DUONEB) 0.5-2.5 (3) MG/3ML SOLN Take 3 mLs by nebulization every 6 (six) hours as needed. 04/25/18   Kayleen Memos, DO  levothyroxine (SYNTHROID, LEVOTHROID) 175 MCG tablet Take 175 mcg by mouth daily.     [provider]  montelukast (SINGULAIR) 10 MG tablet Take 1 tablet (10 mg  total) by mouth at bedtime. 04/25/18   Kayleen Memos, DO  budesonide-formoterol (SYMBICORT) 80-4.5 MCG/ACT inhaler Inhale 2 puffs into the lungs 2 (two) times daily. 10/02/18 01/10/20  Isla Pence, MD    Allergies Patient has no known allergies.   REVIEW OF SYSTEMS  Negative except as noted here or in the History of Present Illness.   PHYSICAL EXAMINATION  Initial Vital Signs Blood pressure (!) 130/94, pulse 60, temperature 98.7 F (37.1 C), temperature source Oral, resp. rate 16, height 5\' 6"  (1.676 m), weight 90.7 kg, SpO2 98 %.  Examination General: Well-developed, well-nourished female in no acute distress; appearance consistent with age of record HENT: normocephalic; atraumatic; TMs normal; no pharyngeal erythema or exudate Eyes: pupils equal, round and reactive to light; extraocular muscles intact Neck: supple; mild anterior cervical lymphadenopathy  Heart: regular rate and rhythm Lungs: clear to auscultation bilaterally Abdomen: soft; nondistended; nontender; bowel sounds present Extremities: No deformity; full range of motion; pulses normal Neurologic: Awake, alert and oriented; motor function intact in all extremities and symmetric; no facial droop Skin: Warm and dry Psychiatric: Normal mood and affect   RESULTS  Summary of this visit's results, reviewed and interpreted by myself:   EKG Interpretation  Date/Time:    Ventricular Rate:    PR Interval:    QRS Duration:  QT Interval:    QTC Calculation:   R Axis:     Text Interpretation:        Laboratory Studies: Results for orders placed or performed during the hospital encounter of 01/09/20 (from the past 24 hour(s))  Group A Strep by PCR     Status: None   Collection Time: 01/10/20 12:45 AM   Specimen: Throat; Sterile Swab  Result Value Ref Range   Group A Strep by PCR NOT DETECTED NOT DETECTED   Imaging Studies: No results found.  ED COURSE and MDM  Nursing notes, initial and subsequent vitals  signs, including pulse oximetry, reviewed and interpreted by myself.  Vitals:   01/09/20 2227 01/09/20 2228 01/10/20 0033  BP: (!) 141/100  (!) 130/94  Pulse: 70  60  Resp: 18  16  Temp: 98.7 F (37.1 C)    TempSrc: Oral    SpO2: 100%  98%  Weight:  90.7 kg   Height:  5\' 6"  (1.676 m)    Medications - No data to display  Sore throat is not atypical adverse reaction to the Covid vaccine.  This may represent an unrelated viral illness or reflux.  Strep is negative.  PROCEDURES  Procedures   ED DIAGNOSES     ICD-10-CM   1. Sore throat  J02.9        Coriana Angello, , MD 01/10/20 [phone]

## 2020-01-20 IMAGING — DX DG CHEST 1V PORT
1 series · 1 of 1 positions shown · non-contrast
Comparison: 03/09/2017

CLINICAL DATA: Wheezing and short of breath

EXAM:
PORTABLE CHEST 1 VIEW

[chest ap]
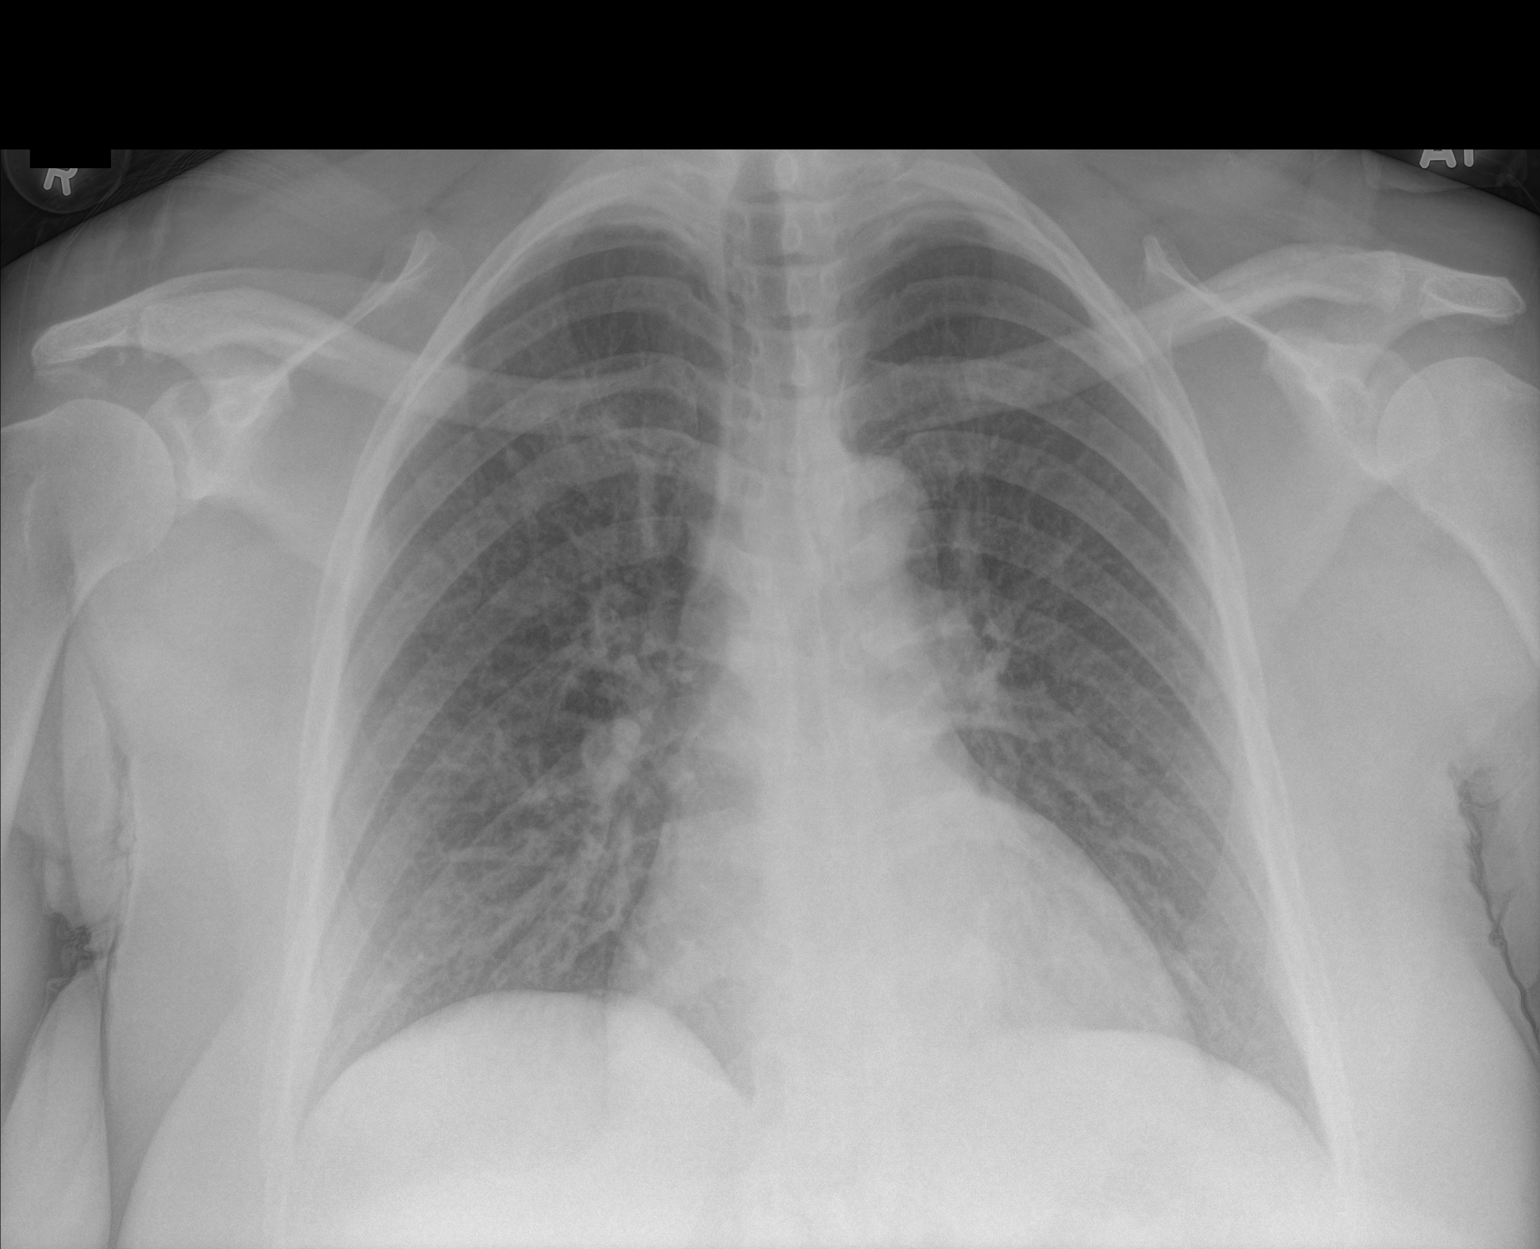

[1 of 1 positions shown; findings below may reference images not displayed]

FINDINGS: No acute consolidation or pleural effusion. Mild bronchitic changes.
Normal heart size. No pneumothorax.
IMPRESSION: No active disease.  Mild bronchitic changes

## 2020-09-13 ENCOUNTER — Other Ambulatory Visit: Payer: Self-pay

## 2020-09-13 ENCOUNTER — Emergency Department (HOSPITAL_BASED_OUTPATIENT_CLINIC_OR_DEPARTMENT_OTHER)
Admission: EM | Admit: 2020-09-13 | Discharge: 2020-09-13 | Disposition: A | Discharge status: Home / Self Care | Payer: 59 | Attending: Emergency Medicine | Admitting: Emergency Medicine

## 2020-09-13 ENCOUNTER — Encounter (HOSPITAL_BASED_OUTPATIENT_CLINIC_OR_DEPARTMENT_OTHER): Payer: Self-pay | Admitting: *Deleted

## 2020-09-13 DIAGNOSIS — J069 Acute upper respiratory infection, unspecified: Secondary | ICD-10-CM | POA: Insufficient documentation

## 2020-09-13 DIAGNOSIS — Z7951 Long term (current) use of inhaled steroids: Secondary | ICD-10-CM | POA: Diagnosis not present

## 2020-09-13 DIAGNOSIS — Z20822 Contact with and (suspected) exposure to covid-19: Secondary | ICD-10-CM | POA: Insufficient documentation

## 2020-09-13 DIAGNOSIS — J441 Chronic obstructive pulmonary disease with (acute) exacerbation: Secondary | ICD-10-CM | POA: Insufficient documentation

## 2020-09-13 DIAGNOSIS — Z79899 Other long term (current) drug therapy: Secondary | ICD-10-CM | POA: Diagnosis not present

## 2020-09-13 DIAGNOSIS — R0602 Shortness of breath: Secondary | ICD-10-CM | POA: Diagnosis present

## 2020-09-13 DIAGNOSIS — E039 Hypothyroidism, unspecified: Secondary | ICD-10-CM | POA: Diagnosis not present

## 2020-09-13 LAB — RESP PANEL BY RT-PCR (FLU A&B, COVID) ARPGX2
Influenza A by PCR: NEGATIVE
Influenza B by PCR: NEGATIVE
SARS Coronavirus 2 by RT PCR: NEGATIVE

## 2020-09-13 LAB — GROUP A STREP BY PCR: Group A Strep by PCR: NOT DETECTED

## 2020-09-13 MED ORDER — IPRATROPIUM BROMIDE HFA 17 MCG/ACT IN AERS
2.0000 | INHALATION_SPRAY | Freq: Once | RESPIRATORY_TRACT | Status: AC
  Administered 2020-09-13: 2 via RESPIRATORY_TRACT
  Filled 2020-09-13: qty 12.9

## 2020-09-13 MED ORDER — DEXAMETHASONE SODIUM PHOSPHATE 10 MG/ML IJ SOLN: 16.0000 mg | Freq: Once | INTRAMUSCULAR | Status: DC

## 2020-09-13 MED ORDER — ALBUTEROL SULFATE HFA 108 (90 BASE) MCG/ACT IN AERS
8.0000 | INHALATION_SPRAY | RESPIRATORY_TRACT | Status: AC
  Administered 2020-09-13 (×3): 8 via RESPIRATORY_TRACT
  Filled 2020-09-13: qty 6.7

## 2020-09-13 MED ORDER — DEXAMETHASONE SODIUM PHOSPHATE 10 MG/ML IJ SOLN
16.0000 mg | Freq: Once | INTRAMUSCULAR | Status: DC
  Filled 2020-09-13: qty 2

## 2020-09-13 MED ORDER — PREDNISONE 50 MG PO TABS: 60.0000 mg | ORAL_TABLET | Freq: Once | ORAL | Status: DC

## 2020-09-13 NOTE — ED Provider Notes (Signed)
[facility] EMERGENCY DEPARTMENT Provider Note   CSN: [id number] Arrival date & time: 09/13/20  1409     History Chief Complaint  Patient presents with  . Shortness of Breath    Jen Benedict is a 54 y.o. female.   Shortness of Breath Severity:  Moderate Onset quality:  Gradual Timing:  Constant Progression:  Unchanged Chronicity:  Recurrent Context comment:  Asthma and uri Relieved by:  Nothing Worsened by:  Nothing Ineffective treatments:  None tried Associated symptoms: headaches and sore throat   Associated symptoms: no chest pain, no cough, no fever, no rash and no vomiting        Past Medical History:  Diagnosis Date  . Asthma   . Bronchitis   . COPD (chronic obstructive pulmonary disease) (HCC)   . Grave's disease   . Hypothyroidism   . Shortness of breath 10/22/11   "all the time right now"    Patient Active Problem List   Diagnosis Date Noted  . Acute respiratory failure with hypoxia (HCC) 04/23/2018  . COPD exacerbation (HCC) 04/03/2018  . Acute respiratory failure (HCC) 06/15/2016  . Abnormal ECG 06/15/2016  . Acute hyperglycemia 06/15/2016  . Hypothyroidism 06/04/2016  . Hypokalemia 06/04/2016  . Acute asthmatic bronchitis 10/22/2011  . Thyroid disease 10/22/2011  . Grave's disease     Past Surgical History:  Procedure Laterality Date  . ABDOMINAL HYSTERECTOMY  11/2007  . ANKLE SURGERY Left   . APPENDECTOMY  11/2007  . CESAREAN SECTION  1986; 1987; 1993; 1997     OB History   No obstetric history on file.     Family History  Problem Relation Age of Onset  . Sarcoidosis Mother     Social History   Tobacco Use  . Smoking status: Never Smoker  . Smokeless tobacco: Never Used  Vaping Use  . Vaping Use: Never used  Substance Use Topics  . Alcohol use: No  . Drug use: No    Home Medications Prior to Admission medications   Medication Sig Start Date End Date Taking? Authorizing Provider  albuterol (PROVENTIL  HFA;VENTOLIN HFA) 108 (90 BASE) MCG/ACT inhaler Inhale 2 puffs into the lungs every 4 (four) hours as needed for wheezing or shortness of breath. 06/14/15   Horton, Mayer Masker, MD  fluticasone (FLONASE) 50 MCG/ACT nasal spray Place 1-2 sprays into both nostrils daily. 04/07/18   Narda Bonds, MD  ipratropium-albuterol (DUONEB) 0.5-2.5 (3) MG/3ML SOLN Take 3 mLs by nebulization every 6 (six) hours as needed. 04/25/18   Darlin Drop, DO  levothyroxine (SYNTHROID, LEVOTHROID) 175 MCG tablet Take 175 mcg by mouth daily.     [provider]  montelukast (SINGULAIR) 10 MG tablet Take 1 tablet (10 mg total) by mouth at bedtime. 04/25/18   Darlin Drop, DO  budesonide-formoterol (SYMBICORT) 80-4.5 MCG/ACT inhaler Inhale 2 puffs into the lungs 2 (two) times daily. 10/02/18 01/10/20  Jacalyn Lefevre, MD    Allergies    Patient has no known allergies.  Review of Systems   Review of Systems  Constitutional: Positive for chills. Negative for fever.  HENT: Positive for congestion, rhinorrhea and sore throat.   Respiratory: Positive for shortness of breath. Negative for cough.   Cardiovascular: Negative for chest pain and palpitations.  Gastrointestinal: Negative for diarrhea, nausea and vomiting.  Genitourinary: Negative for difficulty urinating and dysuria.  Musculoskeletal: Positive for myalgias. Negative for arthralgias and back pain.  Skin: Negative for rash and wound.  Neurological: Positive for headaches.  Negative for light-headedness.    Physical Exam Updated Vital Signs BP (!) 156/96 (BP Location: Right Arm)   Pulse 71   Temp 97.7 F (36.5 C) (Oral)   Resp 16   Ht 5\' 6"  (1.676 m)   Wt 90.7 kg   SpO2 99%   BMI 32.28 kg/m   Physical Exam Vitals and nursing note reviewed. Exam conducted with a chaperone present.  Constitutional:      General: She is not in acute distress.    Appearance: Normal appearance.  HENT:     Head: Normocephalic and atraumatic.     Nose: No  rhinorrhea.  Eyes:     General:        Right eye: No discharge.        Left eye: No discharge.     Conjunctiva/sclera: Conjunctivae normal.  Cardiovascular:     Rate and Rhythm: Normal rate and regular rhythm.  Pulmonary:     Effort: Pulmonary effort is normal. No tachypnea, accessory muscle usage or respiratory distress.     Breath sounds: No stridor. Wheezing present. No decreased breath sounds, rhonchi or rales.  Abdominal:     General: Abdomen is flat. There is no distension.     Palpations: Abdomen is soft.  Musculoskeletal:        General: No tenderness or signs of injury.  Skin:    General: Skin is warm and dry.  Neurological:     General: No focal deficit present.     Mental Status: She is alert. Mental status is at baseline.     Motor: No weakness.  Psychiatric:        Mood and Affect: Mood normal.        Behavior: Behavior normal.     ED Results / Procedures / Treatments   Labs (all labs ordered are listed, but only abnormal results are displayed) Labs Reviewed  RESP PANEL BY RT-PCR (FLU A&B, COVID) [id number]  GROUP A STREP BY PCR    EKG None  Radiology No results found.  Procedures Procedures (including critical care time)  Medications Ordered in ED Medications  albuterol (VENTOLIN HFA) 108 (90 Base) MCG/ACT inhaler 8 puff (8 puffs Inhalation Given 09/13/20 1644)  dexamethasone (DECADRON) injection 16 mg (has no administration in time range)  ipratropium (ATROVENT HFA) inhaler 2 puff (2 puffs Inhalation Given 09/13/20 1624)    ED Course  I have reviewed the triage vital signs and the nursing notes.  Pertinent labs & imaging results that were available during my care of the patient were reviewed by me and considered in my medical decision making (see chart for details).    MDM Rules/Calculators/A&P                          History of asthma, URI type symptoms, faint wheezing, given treatments here.  Improvement afterwards sent home with steroid  injection per request.  Viral testing negative.  Strep screen negative throat without any signs of deep space infection mild erythema.  Safe for outpatient management return precautions provided. Final Clinical Impression(s) / ED Diagnoses Final diagnoses:  SOB (shortness of breath)  Upper respiratory tract infection, unspecified type    Rx / DC Orders ED Discharge Orders    None       14/2/21, MD 09/13/20 1925

## 2020-09-13 NOTE — ED Triage Notes (Signed)
Increased SOb x 3 days HX asthma

## 2020-09-13 NOTE — ED Notes (Signed)
ED Provider at bedside. 

## 2021-03-28 ENCOUNTER — Encounter (HOSPITAL_BASED_OUTPATIENT_CLINIC_OR_DEPARTMENT_OTHER): Payer: Self-pay

## 2021-03-28 ENCOUNTER — Other Ambulatory Visit: Payer: Self-pay

## 2021-03-28 ENCOUNTER — Emergency Department (HOSPITAL_BASED_OUTPATIENT_CLINIC_OR_DEPARTMENT_OTHER): Payer: 59

## 2021-03-28 ENCOUNTER — Emergency Department (HOSPITAL_BASED_OUTPATIENT_CLINIC_OR_DEPARTMENT_OTHER)
Admission: EM | Admit: 2021-03-28 | Discharge: 2021-03-28 | Disposition: A | Discharge status: Home / Self Care | Payer: 59 | Attending: Emergency Medicine | Admitting: Emergency Medicine

## 2021-03-28 DIAGNOSIS — Z79899 Other long term (current) drug therapy: Secondary | ICD-10-CM | POA: Diagnosis not present

## 2021-03-28 DIAGNOSIS — R062 Wheezing: Secondary | ICD-10-CM

## 2021-03-28 DIAGNOSIS — E039 Hypothyroidism, unspecified: Secondary | ICD-10-CM | POA: Insufficient documentation

## 2021-03-28 DIAGNOSIS — Z7951 Long term (current) use of inhaled steroids: Secondary | ICD-10-CM | POA: Insufficient documentation

## 2021-03-28 DIAGNOSIS — J441 Chronic obstructive pulmonary disease with (acute) exacerbation: Secondary | ICD-10-CM | POA: Diagnosis not present

## 2021-03-28 DIAGNOSIS — R0602 Shortness of breath: Secondary | ICD-10-CM | POA: Diagnosis present

## 2021-03-28 DIAGNOSIS — J45901 Unspecified asthma with (acute) exacerbation: Secondary | ICD-10-CM | POA: Insufficient documentation

## 2021-03-28 MED ORDER — IPRATROPIUM BROMIDE 0.02 % IN SOLN
RESPIRATORY_TRACT | Status: AC
  Administered 2021-03-28: 0.5 mg via RESPIRATORY_TRACT
  Filled 2021-03-28: qty 2.5

## 2021-03-28 MED ORDER — IPRATROPIUM BROMIDE 0.02 % IN SOLN
0.5000 mg | Freq: Once | RESPIRATORY_TRACT | Status: AC
  Administered 2021-03-28: 0.5 mg via RESPIRATORY_TRACT
  Filled 2021-03-28: qty 2.5

## 2021-03-28 MED ORDER — ALBUTEROL SULFATE (2.5 MG/3ML) 0.083% IN NEBU: 5.0000 mg | INHALATION_SOLUTION | RESPIRATORY_TRACT | Status: DC | PRN

## 2021-03-28 MED ORDER — PREDNISONE 50 MG PO TABS
60.0000 mg | ORAL_TABLET | Freq: Once | ORAL | Status: AC
  Administered 2021-03-28: 60 mg via ORAL
  Filled 2021-03-28: qty 1

## 2021-03-28 MED ORDER — IPRATROPIUM BROMIDE 0.02 % IN SOLN: 0.5000 mg | RESPIRATORY_TRACT | Status: DC | PRN

## 2021-03-28 MED ORDER — ALBUTEROL SULFATE (2.5 MG/3ML) 0.083% IN NEBU
5.0000 mg | INHALATION_SOLUTION | Freq: Once | RESPIRATORY_TRACT | Status: AC
  Administered 2021-03-28: 5 mg via RESPIRATORY_TRACT
  Filled 2021-03-28: qty 6

## 2021-03-28 MED ORDER — ALBUTEROL SULFATE (2.5 MG/3ML) 0.083% IN NEBU
INHALATION_SOLUTION | RESPIRATORY_TRACT | Status: AC
  Administered 2021-03-28: 5 mg via RESPIRATORY_TRACT
  Filled 2021-03-28: qty 6

## 2021-03-28 MED ORDER — PREDNISONE 20 MG PO TABS: ORAL_TABLET | ORAL | 0 refills | Status: DC

## 2021-03-28 MED ORDER — ALBUTEROL (5 MG/ML) CONTINUOUS INHALATION SOLN
10.0000 mg/h | INHALATION_SOLUTION | Freq: Once | RESPIRATORY_TRACT | Status: AC
  Administered 2021-03-28: 10 mg/h via RESPIRATORY_TRACT
  Filled 2021-03-28: qty 20

## 2021-03-28 NOTE — Discharge Instructions (Signed)
Please read and follow all provided instructions.  Your diagnoses today include:  1. Exacerbation of asthma, unspecified asthma severity, unspecified whether persistent   2. Wheezing     Tests performed today include: Chest x-ray - no signs of pneumonia EKG Vital signs. See below for your results today.   Medications prescribed:   Take any prescribed medications only as directed.  Home care instructions:  Follow any educational materials contained in this packet.  Follow-up instructions: Please follow-up with your primary care provider in the next 3 days for further evaluation of your symptoms and management of your asthma.  Return instructions:  Please return to the Emergency Department if you experience worsening symptoms. Please return with worsening wheezing, shortness of breath, or difficulty breathing. Return with persistent fever above 101F.  Please return if you have any other emergent concerns.  Additional Information:  Your vital signs today were: BP 132/81   Pulse 70   Temp 98.4 F (36.9 C) (Oral)   Resp 17   Ht 5\' 6"  (1.676 m)   Wt 90.7 kg   SpO2 97%   BMI 32.28 kg/m  If your blood pressure (BP) was elevated above 135/85 this visit, please have this repeated by your doctor within one month. --------------

## 2021-03-28 NOTE — ED Triage Notes (Signed)
Pt c/o SOB since Monday, inspiratory and expiratory wheezing noted upon arrival to room. Ambulatory with SpO2 maintaining at 97%. Hx of asthma. Been taking inhalers and nebs with minimal relief.

## 2021-03-28 NOTE — ED Provider Notes (Signed)
[facility] EMERGENCY DEPARTMENT Provider Note   CSN: [id number] Arrival date & time: 03/28/21  1010     History Chief Complaint  Patient presents with   Asthma   Shortness of Breath    Maria Everett is a 55 y.o. female.  Patient with history of asthma/COPD presents to the emergency department with worsening chest tightness and shortness of breath starting 2-3 days ago.  States that she was on a short steroid taper about a week ago for cold.  She thinks that the heat has triggered her asthma symptoms.  Symptoms are similar to same.  She has been using home albuterol treatments without improvement.  She has been hydrating herself well.  No fevers, nausea or vomiting.  No chest pain or abdominal pain.  Onset of symptoms gradual.  Course is worsening.  Nothing make symptoms better or worse.  She works at a daycare.  No definite COVID contacts.      Past Medical History:  Diagnosis Date   Asthma    Bronchitis    COPD (chronic obstructive pulmonary disease) (HCC)    Grave's disease    Hypothyroidism    Shortness of breath 10/22/11   "all the time right now"    Patient Active Problem List   Diagnosis Date Noted   Acute respiratory failure with hypoxia (HCC) 04/23/2018   COPD exacerbation (HCC) 04/03/2018   Acute respiratory failure (HCC) 06/15/2016   Abnormal ECG 06/15/2016   Acute hyperglycemia 06/15/2016   Hypothyroidism 06/04/2016   Hypokalemia 06/04/2016   Acute asthmatic bronchitis 10/22/2011   Thyroid disease 10/22/2011   Grave's disease     Past Surgical History:  Procedure Laterality Date   ABDOMINAL HYSTERECTOMY  11/2007   ANKLE SURGERY Left    APPENDECTOMY  11/2007   CESAREAN SECTION  1986; 1987; 1993; 1997     OB History   No obstetric history on file.     Family History  Problem Relation Age of Onset   Sarcoidosis Mother     Social History   Tobacco Use   Smoking status: Never   Smokeless tobacco: Never  Vaping Use   Vaping Use: Never  used  Substance Use Topics   Alcohol use: No   Drug use: No    Home Medications Prior to Admission medications   Medication Sig Start Date End Date Taking? Authorizing Provider  albuterol (PROVENTIL HFA;VENTOLIN HFA) 108 (90 BASE) MCG/ACT inhaler Inhale 2 puffs into the lungs every 4 (four) hours as needed for wheezing or shortness of breath. 06/14/15  Yes Horton, Mayer Masker, MD  ipratropium-albuterol (DUONEB) 0.5-2.5 (3) MG/3ML SOLN Take 3 mLs by nebulization every 6 (six) hours as needed. 04/25/18  Yes Darlin Drop, DO  levothyroxine (SYNTHROID, LEVOTHROID) 175 MCG tablet Take 175 mcg by mouth daily.    Yes [provider]  montelukast (SINGULAIR) 10 MG tablet Take 1 tablet (10 mg total) by mouth at bedtime. 04/25/18  Yes Hall, Carole N, DO  fluticasone (FLONASE) 50 MCG/ACT nasal spray Place 1-2 sprays into both nostrils daily. 04/07/18   Narda Bonds, MD  budesonide-formoterol (SYMBICORT) 80-4.5 MCG/ACT inhaler Inhale 2 puffs into the lungs 2 (two) times daily. 10/02/18 01/10/20  Jacalyn Lefevre, MD    Allergies    Patient has no known allergies.  Review of Systems   Review of Systems  Constitutional:  Negative for fever.  HENT:  Negative for rhinorrhea and sore throat.   Eyes:  Negative for redness.  Respiratory:  Positive  for chest tightness, shortness of breath and wheezing. Negative for cough.   Cardiovascular:  Negative for chest pain and leg swelling.  Gastrointestinal:  Negative for abdominal pain, diarrhea, nausea and vomiting.  Genitourinary:  Negative for dysuria, frequency, hematuria and urgency.  Musculoskeletal:  Negative for myalgias.  Skin:  Negative for rash.  Neurological:  Negative for headaches.   Physical Exam Updated Vital Signs BP (!) 154/81 (BP Location: Right Arm)   Pulse 66   Temp 98.4 F (36.9 C) (Oral)   Resp 16   Ht 5\' 6"  (1.676 m)   Wt 90.7 kg   SpO2 99%   BMI 32.28 kg/m   Physical Exam Vitals and nursing note reviewed.   Constitutional:      General: She is not in acute distress.    Appearance: She is well-developed.  HENT:     Head: Normocephalic and atraumatic.     Right Ear: External ear normal.     Left Ear: External ear normal.     Nose: Nose normal.  Eyes:     Conjunctiva/sclera: Conjunctivae normal.  Cardiovascular:     Rate and Rhythm: Normal rate and regular rhythm.     Heart sounds: No murmur heard. Pulmonary:     Effort: No tachypnea, accessory muscle usage or respiratory distress.     Breath sounds: Examination of the right-upper field reveals wheezing. Examination of the left-upper field reveals wheezing. Examination of the right-middle field reveals wheezing. Examination of the left-middle field reveals wheezing. Examination of the right-lower field reveals wheezing. Examination of the left-lower field reveals wheezing. Wheezing (Inspiratory/expiratory) present. No decreased breath sounds, rhonchi or rales.  Abdominal:     Palpations: Abdomen is soft.     Tenderness: There is no abdominal tenderness. There is no guarding or rebound.  Musculoskeletal:     Cervical back: Normal range of motion and neck supple.     Right lower leg: No edema.     Left lower leg: No edema.  Skin:    General: Skin is warm and dry.     Findings: No rash.  Neurological:     General: No focal deficit present.     Mental Status: She is alert. Mental status is at baseline.     Motor: No weakness.  Psychiatric:        Mood and Affect: Mood normal.    ED Results / Procedures / Treatments   Labs (all labs ordered are listed, but only abnormal results are displayed) Labs Reviewed - No data to display  EKG EKG Interpretation  Date/Time:  Thursday March 28 2021 10:20:49 EDT Ventricular Rate:  64 PR Interval:  132 QRS Duration: 91 QT Interval:  403 QTC Calculation: 416 R Axis:   79 Text Interpretation: Sinus rhythm Abnormal T, consider ischemia, diffuse leads No significant change since last tracing  Reconfirmed by 06-24-1977 [phone]) on 03/28/2021 10:30:32 AM  Radiology No results found.  Procedures Procedures   Medications Ordered in ED Medications  albuterol (PROVENTIL) (2.5 MG/3ML) 0.083% nebulizer solution 5 mg (5 mg Nebulization Given 03/28/21 1119)  ipratropium (ATROVENT) nebulizer solution 0.5 mg (0.5 mg Nebulization Given 03/28/21 1119)  albuterol (PROVENTIL) (2.5 MG/3ML) 0.083% nebulizer solution 5 mg (5 mg Nebulization Given 03/28/21 1040)  ipratropium (ATROVENT) nebulizer solution 0.5 mg (0.5 mg Nebulization Given 03/28/21 1040)  predniSONE (DELTASONE) tablet 60 mg (60 mg Oral Given 03/28/21 1051)  albuterol (PROVENTIL,VENTOLIN) solution continuous neb (10 mg/hr Nebulization Given 03/28/21 1158)    ED Course  I have reviewed the triage vital signs and the nursing notes.  Pertinent labs & imaging results that were available during my care of the patient were reviewed by me and considered in my medical decision making (see chart for details).  Patient seen and examined. Work-up initiated. Medications ordered.  She is not hypoxic and was able to ambulate at 97%.  EKG reviewed by myself with Dr. Madilyn Hook.  It is abnormal but unchanged from previous over the past few years.  Vital signs reviewed and are as follows: BP (!) 154/81 (BP Location: Right Arm)   Pulse 66   Temp 98.4 F (36.9 C) (Oral)   Resp 16   Ht 5\' 6"  (1.676 m)   Wt 90.7 kg   SpO2 99%   BMI 32.28 kg/m   11:19 AM Per RT, little improvement with first treatment. 2nd treatment ordered.   2:49 PM patient received continuous nebulizer treatment for 1 hour after second treatment given.  After this was completed, she was reexamined and states that she is feeling better.  Inspiratory wheezing is resolved.  She continues to have expiratory wheezing throughout the lung fields.  However, increased work of breathing remains okay and she states that subjectively she feels better.  Plan for discharged home at this time.   Will give 12-day prednisone taper and have patient perform every 4 hour nebulizer treatments for the next 24 to 48 hours at home.  Encourage PCP follow-up in the next 2 to 3 days for recheck.  Encouraged return to the emergency department with worsening shortness of breath, increased work of breathing, chest pain, new symptoms or other concerns.  She verbalizes understanding agrees with plan.  Patient discussed with Dr. at shift change.     MDM Rules/Calculators/A&P                          Patient with asthma exacerbation.  Patient was treated as above.  She has not been hypoxic or significantly tachycardic during stay.  She has chest tightness which was subjectively improved with nebulizer treatments here in the ED.  She was also given oral prednisone.  Chest x-ray was clear without signs of edema or pneumonia.  Doubt COVID.  Doubt cardiac etiology contributing.  She looks well.  She has appropriate medications at home.  Follow-up and return instructions as above.    Final Clinical Impression(s) / ED Diagnoses Final diagnoses:  Exacerbation of asthma, unspecified asthma severity, unspecified whether persistent  Wheezing    Rx / DC Orders ED Discharge Orders          Ordered    predniSONE (DELTASONE) 20 MG tablet        03/28/21 1447             03/30/21, PA-C 03/28/21 1455    03/30/21, MD 03/29/21 1233

## 2021-03-28 NOTE — ED Notes (Addendum)
Patient ambulated from lobby to room 4. Sats remained 97-98%. HR 94. RR 20. Patient states she has had increased SOB since Monday. BBS Ins/exp whz. Patient last had neb at 0630 this AM.  

## 2021-03-28 NOTE — Progress Notes (Signed)
Patient ambulated from lobby to room 4. Sats remained 97-98%. HR 94. RR 20. Patient states she has had increased SOB since Monday. [employer] Ins/exp whz. Patient last had neb at 0630 this AM.

## 2021-07-29 ENCOUNTER — Other Ambulatory Visit: Payer: Self-pay

## 2021-07-29 ENCOUNTER — Encounter (HOSPITAL_BASED_OUTPATIENT_CLINIC_OR_DEPARTMENT_OTHER): Payer: Self-pay | Admitting: Emergency Medicine

## 2021-07-29 ENCOUNTER — Emergency Department (HOSPITAL_BASED_OUTPATIENT_CLINIC_OR_DEPARTMENT_OTHER): Payer: 59

## 2021-07-29 ENCOUNTER — Inpatient Hospital Stay (HOSPITAL_BASED_OUTPATIENT_CLINIC_OR_DEPARTMENT_OTHER)
Admission: EM | Admit: 2021-07-29 | Discharge: 2021-07-31 | DRG: 192 | Disposition: A | Discharge status: Home / Self Care | Payer: 59 | Attending: Family Medicine | Admitting: Family Medicine

## 2021-07-29 DIAGNOSIS — E669 Obesity, unspecified: Secondary | ICD-10-CM | POA: Diagnosis present

## 2021-07-29 DIAGNOSIS — Z23 Encounter for immunization: Secondary | ICD-10-CM | POA: Diagnosis not present

## 2021-07-29 DIAGNOSIS — Z6832 Body mass index (BMI) 32.0-32.9, adult: Secondary | ICD-10-CM

## 2021-07-29 DIAGNOSIS — J4551 Severe persistent asthma with (acute) exacerbation: Secondary | ICD-10-CM

## 2021-07-29 DIAGNOSIS — Z79899 Other long term (current) drug therapy: Secondary | ICD-10-CM

## 2021-07-29 DIAGNOSIS — J441 Chronic obstructive pulmonary disease with (acute) exacerbation: Principal | ICD-10-CM | POA: Diagnosis present

## 2021-07-29 DIAGNOSIS — J45901 Unspecified asthma with (acute) exacerbation: Secondary | ICD-10-CM | POA: Diagnosis not present

## 2021-07-29 DIAGNOSIS — Z79890 Hormone replacement therapy (postmenopausal): Secondary | ICD-10-CM | POA: Diagnosis not present

## 2021-07-29 DIAGNOSIS — R0602 Shortness of breath: Secondary | ICD-10-CM

## 2021-07-29 DIAGNOSIS — Z20822 Contact with and (suspected) exposure to covid-19: Secondary | ICD-10-CM | POA: Diagnosis present

## 2021-07-29 DIAGNOSIS — T380X5A Adverse effect of glucocorticoids and synthetic analogues, initial encounter: Secondary | ICD-10-CM | POA: Diagnosis not present

## 2021-07-29 DIAGNOSIS — E039 Hypothyroidism, unspecified: Secondary | ICD-10-CM | POA: Diagnosis present

## 2021-07-29 DIAGNOSIS — Z7951 Long term (current) use of inhaled steroids: Secondary | ICD-10-CM | POA: Diagnosis not present

## 2021-07-29 DIAGNOSIS — Z7952 Long term (current) use of systemic steroids: Secondary | ICD-10-CM | POA: Diagnosis not present

## 2021-07-29 DIAGNOSIS — D72829 Elevated white blood cell count, unspecified: Secondary | ICD-10-CM | POA: Diagnosis present

## 2021-07-29 DIAGNOSIS — E876 Hypokalemia: Secondary | ICD-10-CM | POA: Diagnosis not present

## 2021-07-29 LAB — RESP PANEL BY RT-PCR (FLU A&B, COVID) ARPGX2
Influenza A by PCR: NEGATIVE
Influenza B by PCR: NEGATIVE
SARS Coronavirus 2 by RT PCR: NEGATIVE

## 2021-07-29 LAB — BASIC METABOLIC PANEL
Anion gap: 8 (ref 5–15)
BUN: 12 mg/dL (ref 6–20)
CO2: 26 mmol/L (ref 22–32)
Calcium: 9.4 mg/dL (ref 8.9–10.3)
Chloride: 105 mmol/L (ref 98–111)
Creatinine, Ser: 0.78 mg/dL (ref 0.44–1.00)
GFR, Estimated: >60 mL/min (ref >60)
Glucose, Bld: 130 mg/dL — ABNORMAL HIGH (ref 70–99)
Potassium: 3.4 mmol/L — ABNORMAL LOW (ref 3.5–5.1)
Sodium: 139 mmol/L (ref 135–145)

## 2021-07-29 LAB — CBC WITH DIFFERENTIAL/PLATELET
Abs Immature Granulocytes: 0.03 10*3/uL (ref 0.00–0.07)
Basophils Absolute: 0 10*3/uL (ref 0.0–0.1)
Basophils Relative: 1 %
Eosinophils Absolute: 0.1 10*3/uL (ref 0.0–0.5)
Eosinophils Relative: 3 %
HCT: 38.9 % (ref 36.0–46.0)
Hemoglobin: 12.5 g/dL (ref 12.0–15.0)
Immature Granulocytes: 1 %
Lymphocytes Relative: 16 %
Lymphs Abs: 0.8 10*3/uL (ref 0.7–4.0)
MCH: 26.2 pg (ref 26.0–34.0)
MCHC: 32.1 g/dL (ref 30.0–36.0)
MCV: 81.4 fL (ref 80.0–100.0)
Monocytes Absolute: 0.1 10*3/uL (ref 0.1–1.0)
Monocytes Relative: 1 %
Neutro Abs: 4 10*3/uL (ref 1.7–7.7)
Neutrophils Relative %: 78 %
Platelets: 273 10*3/uL (ref 150–400)
RBC: 4.78 MIL/uL (ref 3.87–5.11)
RDW: 14 % (ref 11.5–15.5)
WBC: 5.1 10*3/uL (ref 4.0–10.5)
nRBC: 0 % (ref 0.0–0.2)

## 2021-07-29 MED ORDER — MONTELUKAST SODIUM 10 MG PO TABS
10.0000 mg | ORAL_TABLET | Freq: Every day | ORAL | Status: DC
  Administered 2021-07-29 – 2021-07-30 (×2): 10 mg via ORAL
  Filled 2021-07-29 (×2): qty 1

## 2021-07-29 MED ORDER — LEVOTHYROXINE SODIUM 50 MCG PO TABS
175.0000 ug | ORAL_TABLET | Freq: Every day | ORAL | Status: DC
  Administered 2021-07-30 – 2021-07-31 (×2): 175 ug via ORAL
  Filled 2021-07-29 (×2): qty 1

## 2021-07-29 MED ORDER — PREDNISONE 50 MG PO TABS
60.0000 mg | ORAL_TABLET | Freq: Once | ORAL | Status: DC
  Filled 2021-07-29: qty 1

## 2021-07-29 MED ORDER — METHYLPREDNISOLONE SODIUM SUCC 125 MG IJ SOLR
125.0000 mg | Freq: Once | INTRAMUSCULAR | Status: AC
  Administered 2021-07-29: 125 mg via INTRAMUSCULAR
  Filled 2021-07-29: qty 2

## 2021-07-29 MED ORDER — ACETAMINOPHEN 650 MG RE SUPP: 650.0000 mg | Freq: Four times a day (QID) | RECTAL | Status: DC | PRN

## 2021-07-29 MED ORDER — ALBUTEROL (5 MG/ML) CONTINUOUS INHALATION SOLN
10.0000 mg/h | INHALATION_SOLUTION | RESPIRATORY_TRACT | Status: AC
  Administered 2021-07-29: 10 mg/h via RESPIRATORY_TRACT

## 2021-07-29 MED ORDER — ACETAMINOPHEN 325 MG PO TABS
650.0000 mg | ORAL_TABLET | Freq: Four times a day (QID) | ORAL | Status: DC | PRN
  Administered 2021-07-29: 650 mg via ORAL
  Filled 2021-07-29: qty 2

## 2021-07-29 MED ORDER — ACETAMINOPHEN 325 MG PO TABS: 650.0000 mg | ORAL_TABLET | Freq: Four times a day (QID) | ORAL | Status: DC | PRN

## 2021-07-29 MED ORDER — MELATONIN 3 MG PO TABS
3.0000 mg | ORAL_TABLET | Freq: Every evening | ORAL | Status: DC | PRN
  Administered 2021-07-29 – 2021-07-30 (×2): 3 mg via ORAL
  Filled 2021-07-29 (×2): qty 1

## 2021-07-29 MED ORDER — ENOXAPARIN SODIUM 40 MG/0.4ML IJ SOSY
40.0000 mg | PREFILLED_SYRINGE | INTRAMUSCULAR | Status: DC
  Administered 2021-07-29 – 2021-07-30 (×2): 40 mg via SUBCUTANEOUS
  Filled 2021-07-29 (×2): qty 0.4

## 2021-07-29 MED ORDER — MELATONIN 3 MG PO TABS: 3.0000 mg | ORAL_TABLET | Freq: Every day | ORAL | Status: DC

## 2021-07-29 MED ORDER — METHYLPREDNISOLONE SODIUM SUCC 125 MG IJ SOLR
80.0000 mg | Freq: Two times a day (BID) | INTRAMUSCULAR | Status: DC
  Administered 2021-07-29 – 2021-07-30 (×2): 80 mg via INTRAVENOUS
  Filled 2021-07-29 (×2): qty 2

## 2021-07-29 MED ORDER — INFLUENZA VAC SPLIT QUAD 0.5 ML IM SUSY
0.5000 mL | PREFILLED_SYRINGE | INTRAMUSCULAR | Status: AC
  Administered 2021-07-30: 0.5 mL via INTRAMUSCULAR
  Filled 2021-07-29: qty 0.5

## 2021-07-29 MED ORDER — ALBUTEROL SULFATE (2.5 MG/3ML) 0.083% IN NEBU
INHALATION_SOLUTION | RESPIRATORY_TRACT | Status: AC
  Administered 2021-07-29: 5 mg
  Filled 2021-07-29: qty 6

## 2021-07-29 MED ORDER — BUDESONIDE 0.25 MG/2ML IN SUSP
0.2500 mg | Freq: Two times a day (BID) | RESPIRATORY_TRACT | Status: DC
  Administered 2021-07-29 – 2021-07-31 (×4): 0.25 mg via RESPIRATORY_TRACT
  Filled 2021-07-29 (×4): qty 2

## 2021-07-29 MED ORDER — IPRATROPIUM-ALBUTEROL 0.5-2.5 (3) MG/3ML IN SOLN
3.0000 mL | Freq: Four times a day (QID) | RESPIRATORY_TRACT | Status: DC
  Administered 2021-07-29 – 2021-07-31 (×7): 3 mL via RESPIRATORY_TRACT
  Filled 2021-07-29 (×8): qty 3

## 2021-07-29 MED ORDER — ALBUTEROL SULFATE (2.5 MG/3ML) 0.083% IN NEBU
2.5000 mg | INHALATION_SOLUTION | Freq: Once | RESPIRATORY_TRACT | Status: AC
  Administered 2021-07-29: 2.5 mg via RESPIRATORY_TRACT
  Filled 2021-07-29: qty 3

## 2021-07-29 MED ORDER — IPRATROPIUM BROMIDE 0.02 % IN SOLN
RESPIRATORY_TRACT | Status: AC
  Administered 2021-07-29: 0.5 mg
  Filled 2021-07-29: qty 2.5

## 2021-07-29 MED ORDER — POTASSIUM CHLORIDE CRYS ER 20 MEQ PO TBCR
40.0000 meq | EXTENDED_RELEASE_TABLET | Freq: Once | ORAL | Status: AC
  Administered 2021-07-29: 40 meq via ORAL
  Filled 2021-07-29: qty 2

## 2021-07-29 MED ORDER — ALBUTEROL SULFATE (2.5 MG/3ML) 0.083% IN NEBU: 2.5000 mg | INHALATION_SOLUTION | RESPIRATORY_TRACT | Status: DC | PRN

## 2021-07-29 NOTE — ED Provider Notes (Signed)
[facility] EMERGENCY DEPARTMENT Provider Note   CSN: [id number] Arrival date & time: 07/29/21  9563     History Chief Complaint  Patient presents with   Shortness of Breath    Maria Everett is a 55 y.o. female.  Patient presents with a chief complaint shortness of breath.  Symptoms ongoing for 3 days.  She has a history of asthma and feels like it is gotten worse per patient.  She otherwise has developed a cough for about 3 to 4 days as well.  Denies any chest pain denies any fevers.  No vomiting or diarrhea reported.  She has been taking her medications at home without improvement and presents to the ER.      Past Medical History:  Diagnosis Date   Asthma    Bronchitis    COPD (chronic obstructive pulmonary disease) (HCC)    Grave's disease    Hypothyroidism    Shortness of breath 10/22/11   "all the time right now"    Patient Active Problem List   Diagnosis Date Noted   Acute respiratory failure with hypoxia (HCC) 04/23/2018   COPD exacerbation (HCC) 04/03/2018   Acute respiratory failure (HCC) 06/15/2016   Abnormal ECG 06/15/2016   Acute hyperglycemia 06/15/2016   Hypothyroidism 06/04/2016   Hypokalemia 06/04/2016   Acute asthmatic bronchitis 10/22/2011   Thyroid disease 10/22/2011   Grave's disease     Past Surgical History:  Procedure Laterality Date   ABDOMINAL HYSTERECTOMY  11/2007   ANKLE SURGERY Left    APPENDECTOMY  11/2007   CESAREAN SECTION  1986; 1987; 1993; 1997     OB History   No obstetric history on file.     Family History  Problem Relation Age of Onset   Sarcoidosis Mother     Social History   Tobacco Use   Smoking status: Never   Smokeless tobacco: Never  Vaping Use   Vaping Use: Never used  Substance Use Topics   Alcohol use: No   Drug use: No    Home Medications Prior to Admission medications   Medication Sig Start Date End Date Taking? Authorizing Provider  albuterol (PROVENTIL HFA;VENTOLIN HFA) 108 (90  BASE) MCG/ACT inhaler Inhale 2 puffs into the lungs every 4 (four) hours as needed for wheezing or shortness of breath. 06/14/15   Horton, Mayer Masker, MD  fluticasone (FLONASE) 50 MCG/ACT nasal spray Place 1-2 sprays into both nostrils daily. 04/07/18   Narda Bonds, MD  ipratropium-albuterol (DUONEB) 0.5-2.5 (3) MG/3ML SOLN Take 3 mLs by nebulization every 6 (six) hours as needed. 04/25/18   Darlin Drop, DO  levothyroxine (SYNTHROID, LEVOTHROID) 175 MCG tablet Take 175 mcg by mouth daily.     [provider]  montelukast (SINGULAIR) 10 MG tablet Take 1 tablet (10 mg total) by mouth at bedtime. 04/25/18   Darlin Drop, DO  predniSONE (DELTASONE) 20 MG tablet 3 Tabs PO Days 1-3, then 2 tabs PO Days 4-6, then 1 tab PO Day 7-9, then Half Tab PO Day 10-12 03/28/21   Renne Crigler, PA-C  budesonide-formoterol (SYMBICORT) 80-4.5 MCG/ACT inhaler Inhale 2 puffs into the lungs 2 (two) times daily. 10/02/18 01/10/20  Jacalyn Lefevre, MD    Allergies    Patient has no known allergies.  Review of Systems   Review of Systems  Constitutional:  Negative for fever.  HENT:  Negative for ear pain.   Eyes:  Negative for pain.  Respiratory:  Positive for cough and shortness of breath.  Cardiovascular:  Negative for chest pain.  Gastrointestinal:  Negative for abdominal pain.  Genitourinary:  Negative for flank pain.  Musculoskeletal:  Negative for back pain.  Skin:  Negative for rash.  Neurological:  Negative for headaches.   Physical Exam Updated Vital Signs BP 137/84   Pulse 68   Temp 98.3 F (36.8 C) (Oral)   Resp 19   Ht 5\' 6"  (1.676 m)   Wt 90.7 kg   SpO2 95%   BMI 32.28 kg/m   Physical Exam Constitutional:      General: She is not in acute distress.    Appearance: Normal appearance.  HENT:     Head: Normocephalic.     Nose: Nose normal.  Eyes:     Extraocular Movements: Extraocular movements intact.  Cardiovascular:     Rate and Rhythm: Normal rate.  Pulmonary:      Effort: Tachypnea present.     Breath sounds: Wheezing present.  Musculoskeletal:        General: Normal range of motion.     Cervical back: Normal range of motion.  Neurological:     General: No focal deficit present.     Mental Status: She is alert. Mental status is at baseline.    ED Results / Procedures / Treatments   Labs (all labs ordered are listed, but only abnormal results are displayed) Labs Reviewed  RESP PANEL BY RT-PCR (FLU A&B, COVID) [id number]  CBC WITH DIFFERENTIAL/PLATELET  BASIC METABOLIC PANEL    EKG EKG Interpretation  Date/Time:  Monday July 29 2021 07:42:54 EDT Ventricular Rate:  69 PR Interval:  137 QRS Duration: 90 QT Interval:  396 QTC Calculation: 425 R Axis:   82 Text Interpretation: Sinus rhythm Repol abnrm, global ischemia, diffuse leads Confirmed by 12-05-1982 ([id number]) on 07/29/2021 9:44:53 AM  Radiology DG Chest Port 1 View  Result Date: 07/29/2021 CLINICAL DATA:  Shortness of breath EXAM: PORTABLE CHEST 1 VIEW COMPARISON:  03/28/2021 FINDINGS: The heart size and mediastinal contours are within normal limits. Both lungs are clear. The visualized skeletal structures are unremarkable. IMPRESSION: No active disease. Electronically Signed   By: 03/30/2021 M.D.   On: 07/29/2021 08:25    Procedures .Critical Care Performed by: 07/31/2021, MD Authorized by: Cheryll Cockayne, MD   Critical care provider statement:    Critical care time (minutes):  40   Critical care time was exclusive of:  Separately billable procedures and treating other patients and teaching time   Critical care was necessary to treat or prevent imminent or life-threatening deterioration of the following conditions:  Respiratory failure Comments:     Moderate to severe asthma reaction requiring multiple breathing treatments and ultimately continuous albuterol treatments.   Medications Ordered in ED Medications  albuterol (PROVENTIL,VENTOLIN) solution continuous neb (10  mg/hr Nebulization New Bag/Given 07/29/21 0958)  albuterol (PROVENTIL) (2.5 MG/3ML) 0.083% nebulizer solution (5 mg  Given 07/29/21 0744)  ipratropium (ATROVENT) 0.02 % nebulizer solution (0.5 mg  Given 07/29/21 0744)  albuterol (PROVENTIL) (2.5 MG/3ML) 0.083% nebulizer solution 2.5 mg (2.5 mg Nebulization Given 07/29/21 0821)  methylPREDNISolone sodium succinate (SOLU-MEDROL) 125 mg/2 mL injection 125 mg (125 mg Intramuscular Given 07/29/21 0855)    ED Course  I have reviewed the triage vital signs and the nursing notes.  Pertinent labs & imaging results that were available during my care of the patient were reviewed by me and considered in my medical decision making (see chart for details).    MDM Rules/Calculators/A&P  Patient has moderate increased work of breathing accessory muscle use is present and slightly clipped speech.  Lung sounds are wheezes bilaterally with rhonchi.  Chest x-ray is unremarkable multiple breathing treatments provided without significant improvement.  Patient feels her symptoms have not improved and does not feel ready to go home.  [facility] consulted for admission basic labs been ordered.  Final Clinical Impression(s) / ED Diagnoses Final diagnoses:  SOB (shortness of breath)  Severe persistent asthma with exacerbation    Rx / DC Orders ED Discharge Orders     None        Cheryll Cockayne, MD 07/29/21 1204

## 2021-07-29 NOTE — ED Notes (Signed)
Pt ambulatory with steady gait to restroom 

## 2021-07-29 NOTE — ED Notes (Signed)
Unsuccessful IV attempt LAC, labs collected. 

## 2021-07-29 NOTE — Progress Notes (Signed)
Received a phone call from Facility: [facility]  Requesting MD: Dr. Audley Hose Patient with h/o graves disease, asthma,  presenting with SOB/asthma exacerbation x 3 days. Labs wnl, covid negative. CXR clear. Given continuous nebs x 2, given solumedrol, on room air, but still audibly wheezing, tight and dyspneic with exertion.   Plan of care: admit to [facility] for asthma exacerbation   The patient will be accepted for admission to telemetry at [facility] when bed is available.   Nursing staff, Please call the [facility] Admits & Consults System-Wide number at the top of Amion at the time of the patient's arrival so that the patient can be paged to the admitting physician.   Lanney Gins, M.D. [facility]

## 2021-07-29 NOTE — ED Triage Notes (Signed)
Pt arrives pov with c/o shob x 3 days, hx of asthma. Audible wheezing noted. Pt ambulated to room, o2 sats bet 98-93%.

## 2021-07-29 NOTE — H&P (Signed)
History and Physical    Maria Everett GUY:[id number] DOB: Feb 27, 1966 DOA: 07/29/2021  I have briefly reviewed the patient's prior medical records in [facility]  PCP: Ranae Pila, FNP  Patient coming from: home  Chief Complaint: Shortness of breath  HPI: Maria Everett is a 55 y.o. female with medical history significant of chronic intermittent asthma, history of Graves' disease now hypothyroid on supplements, comes to the hospital with complaints of shortness of breath.  Patient tells me that this has been going on for the past 3 days, she has noticed like she was coming down with a cold on Friday, and started becoming more short of breath with walking as well as hearing increased wheezing.  She has a nebulizer machine and has been giving herself treatments over the weekend, however did not improve, breathing has become very labored and decided to come to the emergency room.  She denies any chest pain, denies any fevers but did have chills on Friday, she also had some chest congestion and coughing.  She has no abdominal pain, no nausea or vomiting or diarrhea.  No lightheadedness or dizziness.  ED Course: In the ED she is afebrile 98.3, tachypneic, normotensive and satting well on room air.  She was given continuous nebulizers and steroids in the ED without improvement.  Her blood work is essentially unremarkable.  COVID and flu negative. Chest x-ray clear, no infiltrates, personally reviewed.  EKG shows sinus rhythm  Review of Systems: All systems reviewed, and apart from HPI, all negative  Past Medical History:  Diagnosis Date   Asthma    Bronchitis    COPD (chronic obstructive pulmonary disease) (HCC)    Grave's disease    Hypothyroidism    Shortness of breath 10/22/11   "all the time right now"    Past Surgical History:  Procedure Laterality Date   ABDOMINAL HYSTERECTOMY  11/2007   ANKLE SURGERY Left    APPENDECTOMY  11/2007   CESAREAN SECTION  1986; 1987; 1993; 1997      reports that she has never smoked. She has never used smokeless tobacco. She reports that she does not drink alcohol and does not use drugs.  No Known Allergies  Family History  Problem Relation Age of Onset   Sarcoidosis Mother     Prior to Admission medications   Medication Sig Start Date End Date Taking? Authorizing Provider  albuterol (PROVENTIL HFA;VENTOLIN HFA) 108 (90 BASE) MCG/ACT inhaler Inhale 2 puffs into the lungs every 4 (four) hours as needed for wheezing or shortness of breath. 06/14/15   Horton, Mayer Masker, MD  fluticasone (FLONASE) 50 MCG/ACT nasal spray Place 1-2 sprays into both nostrils daily. 04/07/18   Narda Bonds, MD  ipratropium-albuterol (DUONEB) 0.5-2.5 (3) MG/3ML SOLN Take 3 mLs by nebulization every 6 (six) hours as needed. 04/25/18   Darlin Drop, DO  levothyroxine (SYNTHROID, LEVOTHROID) 175 MCG tablet Take 175 mcg by mouth daily.     [provider]  montelukast (SINGULAIR) 10 MG tablet Take 1 tablet (10 mg total) by mouth at bedtime. 04/25/18   Darlin Drop, DO  predniSONE (DELTASONE) 20 MG tablet 3 Tabs PO Days 1-3, then 2 tabs PO Days 4-6, then 1 tab PO Day 7-9, then Half Tab PO Day 10-12 03/28/21   Renne Crigler, PA-C  budesonide-formoterol (SYMBICORT) 80-4.5 MCG/ACT inhaler Inhale 2 puffs into the lungs 2 (two) times daily. 10/02/18 01/10/20  Jacalyn Lefevre, MD    Physical Exam: Vitals:   07/29/21 1156  07/29/21 1400 07/29/21 1600 07/29/21 1715  BP: 137/84 (!) 145/80 131/75 (!) 147/79  Pulse: 68 88 77 87  Resp: 19 (!) 22 (!) 22 20  Temp:   98.2 F (36.8 C) 98.1 F (36.7 C)  TempSrc:   Oral Oral  SpO2: 95% 96% 96% 94%  Weight:      Height:        Constitutional: Appears somewhat tachypneic but overall comfortable Eyes: PERRL, lids and conjunctivae normal ENMT: Mucous membranes are moist.  Neck: normal, supple Respiratory: Diffuse bilateral wheezing throughout entire lung fields, moves air well, no crackles or rhonchi  heard. Cardiovascular: Regular rate and rhythm, no murmurs / rubs / gallops. No extremity edema.  Abdomen: Nontender, nondistended, bowel sounds positive.  Musculoskeletal: no clubbing / cyanosis. Normal muscle tone.  Skin: no rashes, lesions, ulcers. No induration Neurologic: CN 2-12 grossly intact. Strength 5/5 in all 4.  Psychiatric: Normal judgment and insight. Alert and oriented x 3.   Labs on Admission: I have personally reviewed following labs and imaging studies  CBC: Recent Labs  Lab 07/29/21 1152  WBC 5.1  NEUTROABS 4.0  HGB 12.5  HCT 38.9  MCV 81.4  PLT 273   Basic Metabolic Panel: Recent Labs  Lab 07/29/21 1152  NA 139  K 3.4*  CL 105  CO2 26  GLUCOSE 130*  BUN 12  CREATININE 0.78  CALCIUM 9.4   Liver Function Tests: No results for input(s): AST, ALT, ALKPHOS, BILITOT, PROT, ALBUMIN in the last 168 hours. Coagulation Profile: No results for input(s): INR, PROTIME in the last 168 hours. BNP (last 3 results) No results for input(s): PROBNP in the last 8760 hours. CBG: No results for input(s): GLUCAP in the last 168 hours. Thyroid Function Tests: No results for input(s): TSH, T4TOTAL, FREET4, T3FREE, THYROIDAB in the last 72 hours. Urine analysis:    Component Value Date/Time   COLORURINE YELLOW 06/04/2016 1820   APPEARANCEUR CLEAR 06/04/2016 1820   LABSPEC 1.011 06/04/2016 1820   PHURINE 6.0 06/04/2016 1820   GLUCOSEU NEGATIVE 06/04/2016 1820   HGBUR NEGATIVE 06/04/2016 1820   BILIRUBINUR NEGATIVE 06/04/2016 1820   KETONESUR NEGATIVE 06/04/2016 1820   PROTEINUR NEGATIVE 06/04/2016 1820   UROBILINOGEN 1.0 10/24/2011 1154   NITRITE NEGATIVE 06/04/2016 1820   LEUKOCYTESUR NEGATIVE 06/04/2016 1820     Radiological Exams on Admission: DG Chest Port 1 View  Result Date: 07/29/2021 CLINICAL DATA:  Shortness of breath EXAM: PORTABLE CHEST 1 VIEW COMPARISON:  03/28/2021 FINDINGS: The heart size and mediastinal contours are within normal limits. Both  lungs are clear. The visualized skeletal structures are unremarkable. IMPRESSION: No active disease. Electronically Signed   By: Acquanetta Belling M.D.   On: 07/29/2021 08:25    Assessment/Plan  Principal Problem Asthma exacerbation, underlying moderate persistent asthma-patient presents with progressive shortness of breath over the last 3 days after a possible "cold" last Friday. -Provide IV steroids, nebulizer treatments with duo nebs as well as Pulmicort.  She never had a history of intubation.  Currently on room air which is reassuring -Closely monitor.  Obtain respiratory virus panel  Active Problems Hypothyroidism-continue home Synthroid, has a history of hyperthyroidism.  She tells me her TSH is usually "all over the place", has not checked it in years.  Check TSH in the morning  Hypokalemia-replete potassium  Obesity, class I-based on a BMI of 32, she would benefit from weight loss  DVT prophylaxis: Lovenox Code Status: Full code Family Communication: No family present at bedside Disposition  Plan: Home when improved Bed Type: MedSurg Consults called: None Obs/Inp: Observation   Pamella Pert, MD, PhD [facility]  Contact via [email or web address]  07/29/2021, 5:32 PM

## 2021-07-30 DIAGNOSIS — D72829 Elevated white blood cell count, unspecified: Secondary | ICD-10-CM

## 2021-07-30 DIAGNOSIS — E039 Hypothyroidism, unspecified: Secondary | ICD-10-CM | POA: Diagnosis present

## 2021-07-30 DIAGNOSIS — E669 Obesity, unspecified: Secondary | ICD-10-CM | POA: Diagnosis present

## 2021-07-30 DIAGNOSIS — T380X5A Adverse effect of glucocorticoids and synthetic analogues, initial encounter: Secondary | ICD-10-CM | POA: Diagnosis present

## 2021-07-30 DIAGNOSIS — E876 Hypokalemia: Secondary | ICD-10-CM | POA: Diagnosis present

## 2021-07-30 DIAGNOSIS — Z79899 Other long term (current) drug therapy: Secondary | ICD-10-CM | POA: Diagnosis not present

## 2021-07-30 DIAGNOSIS — J45901 Unspecified asthma with (acute) exacerbation: Secondary | ICD-10-CM | POA: Diagnosis present

## 2021-07-30 DIAGNOSIS — Z7952 Long term (current) use of systemic steroids: Secondary | ICD-10-CM | POA: Diagnosis not present

## 2021-07-30 DIAGNOSIS — J4551 Severe persistent asthma with (acute) exacerbation: Secondary | ICD-10-CM | POA: Diagnosis not present

## 2021-07-30 DIAGNOSIS — Z23 Encounter for immunization: Secondary | ICD-10-CM | POA: Diagnosis not present

## 2021-07-30 DIAGNOSIS — Z79890 Hormone replacement therapy (postmenopausal): Secondary | ICD-10-CM | POA: Diagnosis not present

## 2021-07-30 DIAGNOSIS — Z7951 Long term (current) use of inhaled steroids: Secondary | ICD-10-CM | POA: Diagnosis not present

## 2021-07-30 DIAGNOSIS — J441 Chronic obstructive pulmonary disease with (acute) exacerbation: Secondary | ICD-10-CM | POA: Diagnosis present

## 2021-07-30 DIAGNOSIS — Z6832 Body mass index (BMI) 32.0-32.9, adult: Secondary | ICD-10-CM | POA: Diagnosis not present

## 2021-07-30 DIAGNOSIS — Z20822 Contact with and (suspected) exposure to covid-19: Secondary | ICD-10-CM | POA: Diagnosis present

## 2021-07-30 DIAGNOSIS — R0602 Shortness of breath: Secondary | ICD-10-CM | POA: Diagnosis present

## 2021-07-30 LAB — RESPIRATORY PANEL BY PCR

## 2021-07-30 LAB — COMPREHENSIVE METABOLIC PANEL
ALT: 16 U/L (ref 0–44)
AST: 20 U/L (ref 15–41)
Albumin: 3.8 g/dL (ref 3.5–5.0)
Alkaline Phosphatase: 71 U/L (ref 38–126)
Anion gap: 7 (ref 5–15)
BUN: 15 mg/dL (ref 6–20)
CO2: 24 mmol/L (ref 22–32)
Calcium: 9.6 mg/dL (ref 8.9–10.3)
Chloride: 107 mmol/L (ref 98–111)
Creatinine, Ser: 0.57 mg/dL (ref 0.44–1.00)
GFR, Estimated: >60 mL/min (ref >60)
Glucose, Bld: 105 mg/dL — ABNORMAL HIGH (ref 70–99)
Potassium: 4.4 mmol/L (ref 3.5–5.1)
Sodium: 138 mmol/L (ref 135–145)
Total Bilirubin: 0.8 mg/dL (ref 0.3–1.2)
Total Protein: 7.3 g/dL (ref 6.5–8.1)

## 2021-07-30 LAB — CBC
HCT: 37.9 % (ref 36.0–46.0)
Hemoglobin: 12 g/dL (ref 12.0–15.0)
MCH: 26.1 pg (ref 26.0–34.0)
MCHC: 31.7 g/dL (ref 30.0–36.0)
MCV: 82.6 fL (ref 80.0–100.0)
Platelets: 280 10*3/uL (ref 150–400)
RBC: 4.59 MIL/uL (ref 3.87–5.11)
RDW: 14.3 % (ref 11.5–15.5)
WBC: 17.6 10*3/uL — ABNORMAL HIGH (ref 4.0–10.5)
nRBC: 0 % (ref 0.0–0.2)

## 2021-07-30 LAB — HIV ANTIBODY (ROUTINE TESTING W REFLEX): HIV Screen 4th Generation wRfx: NONREACTIVE

## 2021-07-30 LAB — TSH: TSH: 1.222 u[IU]/mL (ref 0.350–4.500)

## 2021-07-30 MED ORDER — OXYCODONE HCL 5 MG PO TABS: 5.0000 mg | ORAL_TABLET | Freq: Four times a day (QID) | ORAL | Status: DC | PRN

## 2021-07-30 MED ORDER — METHOCARBAMOL 500 MG PO TABS
500.0000 mg | ORAL_TABLET | Freq: Three times a day (TID) | ORAL | Status: DC | PRN
  Administered 2021-07-30 (×2): 500 mg via ORAL
  Filled 2021-07-30 (×2): qty 1

## 2021-07-30 MED ORDER — METHYLPREDNISOLONE SODIUM SUCC 125 MG IJ SOLR
60.0000 mg | Freq: Two times a day (BID) | INTRAMUSCULAR | Status: DC
  Administered 2021-07-30 – 2021-07-31 (×3): 60 mg via INTRAVENOUS
  Filled 2021-07-30 (×3): qty 2

## 2021-07-30 NOTE — Progress Notes (Signed)
Patient was educated on the peak flow meter. It is a new set up. The patient's personal best =440. First try the patient blew 335. Rt will have patient retry at neb treatment due at 1400.

## 2021-07-30 NOTE — Progress Notes (Addendum)
[facility] PROGRESS NOTE   Maria Everett GGY:[id number] DOB: [birth year] DOA: 07/29/2021  PCP: Ranae Pila, FNP  Brief History/Interval Summary: 55 y.o. female with medical history significant of chronic intermittent asthma, history of Graves' disease now hypothyroid on supplements, comes to the hospital with complaints of shortness of breath.  Symptoms have been ongoing for 3 days prior to admission.  She was noted to be wheezing.  She tried taking nebulizer treatments without any improvement.  She was given nebulizer treatments in the emergency department without significant improvement and so was hospitalized for further management.    Reason for Visit: Acute asthma exacerbation  Consultants: None  Procedures: None    Subjective/Interval History: Patient mentions that she is better compared to yesterday but still not back to her baseline.  Still having difficulty breathing and wheezing especially with exertion.  Denies any chest pain nausea or vomiting.     Assessment/Plan:  Acute asthma exacerbation/underlying moderate persistent asthma Patient remains on Solu-Medrol, nebulizer treatments.  She was also on Singulair.  Patient also noted to be on budesonide nebulized.  Has noticed some improvement compared to yesterday but still not back to baseline.  Still has wheezing on examination.  I think she will need at least 1 more day of intensive treatment in the hospital.  We will check peak flows. Influenza, COVID PCR and a respiratory viral panel were all negative.  Chest x-ray was unremarkable.  Hypothyroidism Continue home medications.  Hypokalemia Potassium has improved with repletion.  Leukocytosis Secondary to steroids.  Monitor every so often.  She is afebrile.  No indication for antibacterials.  Obesity Estimated body mass index is 32.28 kg/m as calculated from the following:   Height as of this encounter: 5\' 6"  (1.676 m).   Weight as of this encounter: 90.7  kg.    DVT Prophylaxis: Lovenox Code Status: Full code Family Communication: Discussed with the patient Disposition Plan: Anticipate discharge home when improved  Status is: Observation  The patient will require care spanning > 2 midnights and should be moved to inpatient because: Patient continues to have wheezing, shortness of breath with minimal exertion.  Will need another day of intensive treatment with nebulizers and steroids.     Medications: Scheduled:  budesonide (PULMICORT) nebulizer solution  0.25 mg Nebulization BID   enoxaparin (LOVENOX) injection  40 mg Subcutaneous Q24H   influenza vac split quadrivalent PF  0.5 mL Intramuscular Tomorrow-1000   ipratropium-albuterol  3 mL Nebulization Q6H   levothyroxine  175 mcg Oral Q0600   methylPREDNISolone (SOLU-MEDROL) injection  80 mg Intravenous Q12H   montelukast  10 mg Oral QHS   Continuous: **OR** acetaminophen, albuterol, melatonin  Antibiotics: Anti-infectives (From admission, onward)    None       Objective:  Vital Signs  Vitals:   07/30/21 0058 07/30/21 0510 07/30/21 0720 07/30/21 0723  BP: 121/75 132/85    Pulse: 65 60    Resp: 18 16    Temp: 98.3 F (36.8 C) 98.1 F (36.7 C)    TempSrc: Oral Oral    SpO2: 100% 96% 95% 95%  Weight:      Height:        Intake/Output Summary (Last 24 hours) at 07/30/2021 1027 Last data filed at 07/30/2021 0858 Gross per 24 hour  Intake 220 ml  Output 900 ml  Net -680 ml   Filed Weights   07/29/21 0739  Weight: 90.7 kg    General appearance: Awake alert.  In no distress  Resp: Mildly tachypneic.  No use of accessory muscles.  End expiratory wheezing heard bilaterally.  No definite crackles or rhonchi. Cardio: S1-S2 is normal regular.  No S3-S4.  No rubs murmurs or bruit GI: Abdomen is soft.  Nontender nondistended.  Bowel sounds are present normal.  No masses organomegaly Extremities: No edema.  Full range of motion of lower  extremities. Neurologic: Alert and oriented x3.  No focal neurological deficits.    Lab Results:  Data Reviewed: I have personally reviewed following labs and imaging studies  CBC: Recent Labs  Lab 07/29/21 1152 07/30/21 0448  WBC 5.1 17.6*  NEUTROABS 4.0  --   HGB 12.5 12.0  HCT 38.9 37.9  MCV 81.4 82.6  PLT 273 280    Basic Metabolic Panel: Recent Labs  Lab 07/29/21 1152 07/30/21 0448  NA 139 138  K 3.4* 4.4  CL 105 107  CO2 26 24  GLUCOSE 130* 105*  BUN 12 15  CREATININE 0.78 0.57  CALCIUM 9.4 9.6    GFR: Estimated Creatinine Clearance: 92.3 mL/min (by C-G formula based on SCr of 0.57 mg/dL).  Liver Function Tests: Recent Labs  Lab 07/30/21 0448  AST 20  ALT 16  ALKPHOS 71  BILITOT 0.8  PROT 7.3  ALBUMIN 3.8      Thyroid Function Tests: Recent Labs    07/30/21 0448  TSH 1.222      Recent Results (from the past 240 hour(s))  Resp Panel by RT-PCR (Flu A&B, Covid) Nasopharyngeal Swab     Status: None   Collection Time: 07/29/21  9:21 AM   Specimen: Nasopharyngeal Swab; Nasopharyngeal(NP) swabs in vial transport medium  Result Value Ref Range Status   SARS Coronavirus 2 by RT PCR NEGATIVE NEGATIVE Final    Comment: (NOTE) SARS-CoV-2 target nucleic acids are NOT DETECTED.  The SARS-CoV-2 RNA is generally detectable in upper respiratory specimens during the acute phase of infection. The lowest concentration of SARS-CoV-2 viral copies this assay can detect is 138 copies/mL. A negative result does not preclude SARS-Cov-2 infection and should not be used as the sole basis for treatment or other patient management decisions. A negative result may occur with  improper specimen collection/handling, submission of specimen other than nasopharyngeal swab, presence of viral mutation(s) within the areas targeted by this assay, and inadequate number of viral copies(<138 copies/mL). A negative result must be combined with clinical observations, patient  history, and epidemiological information. The expected result is Negative.  Fact Sheet for Patients:  BloggerCourse.com  Fact Sheet for Healthcare Providers:  SeriousBroker.it  This test is no t yet approved or cleared by the [place] FDA and  has been authorized for detection and/or diagnosis of SARS-CoV-2 by FDA under an Emergency Use Authorization (EUA). This EUA will remain  in effect (meaning this test can be used) for the duration of the COVID-19 declaration under Section 564(b)(1) of the Act, 21 U.S.C.section 360bbb-3(b)(1), unless the authorization is terminated  or revoked sooner.       Influenza A by PCR NEGATIVE NEGATIVE Final   Influenza B by PCR NEGATIVE NEGATIVE Final    Comment: (NOTE) The Xpert Xpress SARS-CoV-2/FLU/RSV plus assay is intended as an aid in the diagnosis of influenza from Nasopharyngeal swab specimens and should not be used as a sole basis for treatment. Nasal washings and aspirates are unacceptable for Xpert Xpress SARS-CoV-2/FLU/RSV testing.  Fact Sheet for Patients: BloggerCourse.com  Fact Sheet for Healthcare Providers: SeriousBroker.it  This test is not yet approved or  cleared by the [place] and has been authorized for detection and/or diagnosis of SARS-CoV-2 by FDA under an Emergency Use Authorization (EUA). This EUA will remain in effect (meaning this test can be used) for the duration of the COVID-19 declaration under Section 564(b)(1) of the Act, 21 U.S.C. section 360bbb-3(b)(1), unless the authorization is terminated or revoked.  Performed at [facility], [address] [place], [place] [address]   Respiratory (~20 pathogens) panel by PCR     Status: None   Collection Time: 07/29/21  5:48 PM   Specimen: Nasopharyngeal Swab; Respiratory  Result Value Ref Range Status   Adenovirus NOT DETECTED NOT DETECTED  Final   Coronavirus 229E NOT DETECTED NOT DETECTED Final    Comment: (NOTE) The Coronavirus on the Respiratory Panel, DOES NOT test for the novel  Coronavirus (2019 nCoV)    Coronavirus HKU1 NOT DETECTED NOT DETECTED Final   Coronavirus NL63 NOT DETECTED NOT DETECTED Final   Coronavirus OC43 NOT DETECTED NOT DETECTED Final   Metapneumovirus NOT DETECTED NOT DETECTED Final   Rhinovirus / Enterovirus NOT DETECTED NOT DETECTED Final   Influenza A NOT DETECTED NOT DETECTED Final   Influenza B NOT DETECTED NOT DETECTED Final   Parainfluenza Virus 1 NOT DETECTED NOT DETECTED Final   Parainfluenza Virus 2 NOT DETECTED NOT DETECTED Final   Parainfluenza Virus 3 NOT DETECTED NOT DETECTED Final   Parainfluenza Virus 4 NOT DETECTED NOT DETECTED Final   Respiratory Syncytial Virus NOT DETECTED NOT DETECTED Final   Bordetella pertussis NOT DETECTED NOT DETECTED Final   Bordetella Parapertussis NOT DETECTED NOT DETECTED Final   Chlamydophila pneumoniae NOT DETECTED NOT DETECTED Final   Mycoplasma pneumoniae NOT DETECTED NOT DETECTED Final    Comment: Performed at [facility], [address], [place], [place] [address]      Radiology Studies: DG Chest Port 1 View  Result Date: 07/29/2021 CLINICAL DATA:  Shortness of breath EXAM: PORTABLE CHEST 1 VIEW COMPARISON:  03/28/2021 FINDINGS: The heart size and mediastinal contours are within normal limits. Both lungs are clear. The visualized skeletal structures are unremarkable. IMPRESSION: No active disease. Electronically Signed   By: Acquanetta Belling M.D.   On: 07/29/2021 08:25       LOS: 0 days   Mel Tadros Rito Ehrlich  [facility] Pager on [email or web address]  07/30/2021, 10:27 AM

## 2021-07-31 LAB — CBC
HCT: 36.7 % (ref 36.0–46.0)
Hemoglobin: 11.6 g/dL — ABNORMAL LOW (ref 12.0–15.0)
MCH: 26.2 pg (ref 26.0–34.0)
MCHC: 31.6 g/dL (ref 30.0–36.0)
MCV: 83 fL (ref 80.0–100.0)
Platelets: 303 10*3/uL (ref 150–400)
RBC: 4.42 MIL/uL (ref 3.87–5.11)
RDW: 14.3 % (ref 11.5–15.5)
WBC: 18.6 10*3/uL — ABNORMAL HIGH (ref 4.0–10.5)
nRBC: 0 % (ref 0.0–0.2)

## 2021-07-31 LAB — BASIC METABOLIC PANEL
Anion gap: 9 (ref 5–15)
BUN: 21 mg/dL — ABNORMAL HIGH (ref 6–20)
CO2: 23 mmol/L (ref 22–32)
Calcium: 9.3 mg/dL (ref 8.9–10.3)
Chloride: 107 mmol/L (ref 98–111)
Creatinine, Ser: 0.86 mg/dL (ref 0.44–1.00)
GFR, Estimated: >60 mL/min (ref >60)
Glucose, Bld: 116 mg/dL — ABNORMAL HIGH (ref 70–99)
Potassium: 4.2 mmol/L (ref 3.5–5.1)
Sodium: 139 mmol/L (ref 135–145)

## 2021-07-31 MED ORDER — PREDNISONE 20 MG PO TABS: 40.0000 mg | ORAL_TABLET | Freq: Every day | ORAL | 0 refills | Status: AC

## 2021-07-31 MED ORDER — ALBUTEROL SULFATE HFA 108 (90 BASE) MCG/ACT IN AERS: 2.0000 | INHALATION_SPRAY | RESPIRATORY_TRACT | 2 refills | Status: DC | PRN

## 2021-07-31 MED ORDER — FLUTICASONE-SALMETEROL 500-50 MCG/ACT IN AEPB: 1.0000 | INHALATION_SPRAY | Freq: Two times a day (BID) | RESPIRATORY_TRACT | 2 refills | Status: DC

## 2021-07-31 MED ORDER — LACTATED RINGERS IV BOLUS
500.0000 mL | Freq: Once | INTRAVENOUS | Status: AC
  Administered 2021-07-31: 500 mL via INTRAVENOUS

## 2021-07-31 NOTE — Discharge Summary (Signed)
Physician Discharge Summary  Maria Everett TDD:[id number] DOB: 1966/06/17 DOA: 07/29/2021  PCP: Ranae Pila, FNP  Admit date: 07/29/2021 Discharge date: 07/31/2021  Time spent: 40 minutes  Recommendations for Outpatient Follow-up:  Follow up CBC/CMP Follow with pulm outpatient - adjust regimen as needed    Discharge Diagnoses:  Active Problems:   Acute severe exacerbation of asthma   Acute asthma exacerbation   Discharge Condition: stable  Diet recommendation: heart healthy  Filed Weights   07/29/21 0739  Weight: 90.7 kg    History of present illness:  55 y.o. female with medical history significant of chronic intermittent asthma, history of Graves' disease now hypothyroid on supplements, comes to the hospital with complaints of shortness of breath.  Symptoms have been ongoing for 3 days prior to admission.  She was noted to be wheezing.  She tried taking nebulizer treatments without any improvement.  She was given nebulizer treatments in the emergency department without significant improvement and so was hospitalized for further management.    She was admitted for an asthma exacerbation.  She's improved with steroids and breathing treatments.  Discharged on 10/19 with recommendation for outpatient pulm follow up at [facility].  [facility]:  Acute asthma exacerbation/underlying moderate persistent asthma No wheezing today, maintains sats on RA Continue prednisone for additional 3 days (had 3 days solumedrol here) Continue singulair Refill albuterol and advair She has pulmonologist outpatient, follow up recommended Influenza, COVID PCR and Dinisha Cai respiratory viral panel were all negative.  Chest x-ray was unremarkable.   Hypothyroidism Continue home medications.  Hypokalemia resolved  Leukocytosis Secondary to steroids, should gradually improve  Obesity Estimated body mass index is 32.28 kg/m as calculated from the following:   Height as of this encounter: 5\' 6"  (1.676  m).   Weight as of this encounter: 90.7 kg.  Procedures: none  Consultations: none  Discharge Exam: Vitals:   07/31/21 0603 07/31/21 0721  BP: (!) 136/92   Pulse: 62   Resp: 14   Temp: 98.3 F (36.8 C)   SpO2: 96% 99%   Feels better Ready for discharge  General: No acute distress. Cardiovascular: Heart sounds show Remus Hagedorn regular rate, and rhythm.  Lungs: Clear to auscultation bilaterally with good air movement. No rales, rhonchi or wheezes. Abdomen: Soft, nontender, nondistended  Neurological: Alert and oriented 3. Moves all extremities 4 . Cranial nerves II through XII grossly intact. Skin: Warm and dry. No rashes or lesions. Extremities: No clubbing or cyanosis. No edema.  Discharge Instructions   Discharge Instructions     Call MD for:  difficulty breathing, headache or visual disturbances   Complete by: As directed    Call MD for:  extreme fatigue   Complete by: As directed    Call MD for:  hives   Complete by: As directed    Call MD for:  persistant dizziness or light-headedness   Complete by: As directed    Call MD for:  persistant nausea and vomiting   Complete by: As directed    Call MD for:  redness, tenderness, or signs of infection (pain, swelling, redness, odor or green/yellow discharge around incision site)   Complete by: As directed    Call MD for:  severe uncontrolled pain   Complete by: As directed    Call MD for:  temperature >100.4   Complete by: As directed    Diet - low sodium heart healthy   Complete by: As directed    Discharge instructions   Complete by: As directed  You were seen for an asthma exacerbation.  You've improved with steroids and breathing treatments.  We'll send you home with an additional 3 days of steroids.  Please resume your albuterol as needed and your advair daily as prescribed.  I'll send refills of those medicines.  Please follow up with your pulmonologist as an outpatient within the next week or so.  Return  for new, recurrent, or worsening symptoms.  Please ask your PCP to request records from this hospitalization so they know what was done and what the next steps will be.   Increase activity slowly   Complete by: As directed       Allergies as of 07/31/2021   No Known Allergies      Medication List     TAKE these medications    albuterol 108 (90 Base) MCG/ACT inhaler Commonly known as: VENTOLIN HFA Inhale 2 puffs into the lungs every 4 (four) hours as needed for wheezing or shortness of breath.   fluticasone 50 MCG/ACT nasal spray Commonly known as: FLONASE Place 1-2 sprays into both nostrils daily. What changed:  when to take this reasons to take this   fluticasone-salmeterol 500-50 MCG/ACT Aepb Commonly known as: ADVAIR Inhale 1 puff into the lungs in the morning and at bedtime.   ipratropium-albuterol 0.5-2.5 (3) MG/3ML Soln Commonly known as: DUONEB Take 3 mLs by nebulization every 6 (six) hours as needed. What changed: reasons to take this   levothyroxine 175 MCG tablet Commonly known as: SYNTHROID Take 175 mcg by mouth daily.   montelukast 10 MG tablet Commonly known as: SINGULAIR Take 1 tablet (10 mg total) by mouth at bedtime.   predniSONE 20 MG tablet Commonly known as: DELTASONE Take 2 tablets (40 mg total) by mouth daily for 3 days. What changed:  how much to take how to take this when to take this additional instructions       No Known Allergies    The results of significant diagnostics from this hospitalization (including imaging, microbiology, ancillary and laboratory) are listed below for reference.    Significant Diagnostic Studies: DG Chest Port 1 View  Result Date: 07/29/2021 CLINICAL DATA:  Shortness of breath EXAM: PORTABLE CHEST 1 VIEW COMPARISON:  03/28/2021 FINDINGS: The heart size and mediastinal contours are within normal limits. Both lungs are clear. The visualized skeletal structures are unremarkable. IMPRESSION: No active  disease. Electronically Signed   By: Acquanetta Belling M.D.   On: 07/29/2021 08:25    Microbiology: Recent Results (from the past 240 hour(s))  Resp Panel by RT-PCR (Flu Kamarion Zagami&B, Covid) Nasopharyngeal Swab     Status: None   Collection Time: 07/29/21  9:21 AM   Specimen: Nasopharyngeal Swab; Nasopharyngeal(NP) swabs in vial transport medium  Result Value Ref Range Status   SARS Coronavirus 2 by RT PCR NEGATIVE NEGATIVE Final    Comment: (NOTE) SARS-CoV-2 target nucleic acids are NOT DETECTED.  The SARS-CoV-2 RNA is generally detectable in upper respiratory specimens during the acute phase of infection. The lowest concentration of SARS-CoV-2 viral copies this assay can detect is 138 copies/mL. Koree Schopf negative result does not preclude SARS-Cov-2 infection and should not be used as the sole basis for treatment or other patient management decisions. Verner Mccrone negative result may occur with  improper specimen collection/handling, submission of specimen other than nasopharyngeal swab, presence of viral mutation(s) within the areas targeted by this assay, and inadequate number of viral copies(<138 copies/mL). Cariann Kinnamon negative result must be combined with clinical observations, patient history, and epidemiological  information. The expected result is Negative.  Fact Sheet for Patients:  BloggerCourse.com  Fact Sheet for Healthcare Providers:  SeriousBroker.it  This test is no t yet approved or cleared by the [place] FDA and  has been authorized for detection and/or diagnosis of SARS-CoV-2 by FDA under an Emergency Use Authorization (EUA). This EUA will remain  in effect (meaning this test can be used) for the duration of the COVID-19 declaration under Section 564(b)(1) of the Act, 21 U.S.C.section 360bbb-3(b)(1), unless the authorization is terminated  or revoked sooner.       Influenza Berenize Gatlin by PCR NEGATIVE NEGATIVE Final   Influenza B by PCR NEGATIVE NEGATIVE  Final    Comment: (NOTE) The Xpert Xpress SARS-CoV-2/FLU/RSV plus assay is intended as an aid in the diagnosis of influenza from Nasopharyngeal swab specimens and should not be used as Nakira Litzau sole basis for treatment. Nasal washings and aspirates are unacceptable for Xpert Xpress SARS-CoV-2/FLU/RSV testing.  Fact Sheet for Patients: BloggerCourse.com  Fact Sheet for Healthcare Providers: SeriousBroker.it  This test is not yet approved or cleared by the [place] FDA and has been authorized for detection and/or diagnosis of SARS-CoV-2 by FDA under an Emergency Use Authorization (EUA). This EUA will remain in effect (meaning this test can be used) for the duration of the COVID-19 declaration under Section 564(b)(1) of the Act, 21 U.S.C. section 360bbb-3(b)(1), unless the authorization is terminated or revoked.  Performed at [facility], [address] [place], [place] [address]   Respiratory (~20 pathogens) panel by PCR     Status: None   Collection Time: 07/29/21  5:48 PM   Specimen: Nasopharyngeal Swab; Respiratory  Result Value Ref Range Status   Adenovirus NOT DETECTED NOT DETECTED Final   Coronavirus 229E NOT DETECTED NOT DETECTED Final    Comment: (NOTE) The Coronavirus on the Respiratory Panel, DOES NOT test for the novel  Coronavirus (2019 nCoV)    Coronavirus HKU1 NOT DETECTED NOT DETECTED Final   Coronavirus NL63 NOT DETECTED NOT DETECTED Final   Coronavirus OC43 NOT DETECTED NOT DETECTED Final   Metapneumovirus NOT DETECTED NOT DETECTED Final   Rhinovirus / Enterovirus NOT DETECTED NOT DETECTED Final   Influenza Offie Pickron NOT DETECTED NOT DETECTED Final   Influenza B NOT DETECTED NOT DETECTED Final   Parainfluenza Virus 1 NOT DETECTED NOT DETECTED Final   Parainfluenza Virus 2 NOT DETECTED NOT DETECTED Final   Parainfluenza Virus 3 NOT DETECTED NOT DETECTED Final   Parainfluenza Virus 4 NOT DETECTED NOT  DETECTED Final   Respiratory Syncytial Virus NOT DETECTED NOT DETECTED Final   Bordetella pertussis NOT DETECTED NOT DETECTED Final   Bordetella Parapertussis NOT DETECTED NOT DETECTED Final   Chlamydophila pneumoniae NOT DETECTED NOT DETECTED Final   Mycoplasma pneumoniae NOT DETECTED NOT DETECTED Final    Comment: Performed at [facility], [address], [place], [place] [address]     Labs: Basic Metabolic Panel: Recent Labs  Lab 07/29/21 1152 07/30/21 0448 07/31/21 0513  NA 139 138 139  K 3.4* 4.4 4.2  CL 105 107 107  CO2 26 24 23   GLUCOSE 130* 105* 116*  BUN 12 15 21*  CREATININE 0.78 0.57 0.86  CALCIUM 9.4 9.6 9.3   Liver Function Tests: Recent Labs  Lab 07/30/21 0448  AST 20  ALT 16  ALKPHOS 71  BILITOT 0.8  PROT 7.3  ALBUMIN 3.8   No results for input(s): LIPASE, AMYLASE in the last 168 hours. No  results for input(s): AMMONIA in the last 168 hours. CBC: Recent Labs  Lab 07/29/21 1152 07/30/21 0448 07/31/21 0513  WBC 5.1 17.6* 18.6*  NEUTROABS 4.0  --   --   HGB 12.5 12.0 11.6*  HCT 38.9 37.9 36.7  MCV 81.4 82.6 83.0  PLT 273 280 303   Cardiac Enzymes: No results for input(s): CKTOTAL, CKMB, CKMBINDEX, TROPONINI in the last 168 hours. BNP: BNP (last 3 results) No results for input(s): BNP in the last 8760 hours.  ProBNP (last 3 results) No results for input(s): PROBNP in the last 8760 hours.  CBG: No results for input(s): GLUCAP in the last 168 hours.     Signed:  Lacretia Nicks MD.  [facility] 07/31/2021, 10:43 AM

## 2021-07-31 NOTE — Progress Notes (Signed)
Patient walked about 200 ft with nurse tech to perform ambulatory 02 eval. Patient maintained at or above 94% saturation throughout duration of ambulation.

## 2021-09-15 ENCOUNTER — Encounter (HOSPITAL_BASED_OUTPATIENT_CLINIC_OR_DEPARTMENT_OTHER): Payer: Self-pay

## 2021-09-15 ENCOUNTER — Emergency Department (HOSPITAL_BASED_OUTPATIENT_CLINIC_OR_DEPARTMENT_OTHER)
Admission: EM | Admit: 2021-09-15 | Discharge: 2021-09-15 | Disposition: A | Discharge status: Home / Self Care | Payer: 59 | Attending: Emergency Medicine | Admitting: Emergency Medicine

## 2021-09-15 DIAGNOSIS — M25562 Pain in left knee: Secondary | ICD-10-CM | POA: Insufficient documentation

## 2021-09-15 DIAGNOSIS — M25561 Pain in right knee: Secondary | ICD-10-CM | POA: Insufficient documentation

## 2021-09-15 DIAGNOSIS — J449 Chronic obstructive pulmonary disease, unspecified: Secondary | ICD-10-CM | POA: Diagnosis not present

## 2021-09-15 DIAGNOSIS — J45909 Unspecified asthma, uncomplicated: Secondary | ICD-10-CM | POA: Diagnosis not present

## 2021-09-15 DIAGNOSIS — E039 Hypothyroidism, unspecified: Secondary | ICD-10-CM | POA: Insufficient documentation

## 2021-09-15 DIAGNOSIS — Z79899 Other long term (current) drug therapy: Secondary | ICD-10-CM | POA: Diagnosis not present

## 2021-09-15 MED ORDER — DICLOFENAC SODIUM 1 % EX GEL: 4.0000 g | Freq: Four times a day (QID) | CUTANEOUS | 0 refills | Status: AC

## 2021-09-15 NOTE — Discharge Instructions (Signed)
Please read the attached information about arthritis.  Specifically I suspect you have osteoarthritis  Ice, Tylenol 1000 mg every 6 hours, Aleve and compression sleeves can be helpful for mitigating your symptoms.  Recommend following up with either primary care or an orthopedic doctor if your symptoms continue/worsen.  You may always return to the ER for any new or concerning symptoms specifically if you develop a fever and worsening knee pain.  I also recommend Voltaren gel applied to your knee

## 2021-09-15 NOTE — ED Triage Notes (Signed)
Pt c/o lumbar back pain and right knee pain x 1.5 weeks. Denies known injury.

## 2021-09-15 NOTE — ED Provider Notes (Signed)
[facility] EMERGENCY DEPARTMENT Provider Note   CSN: [id number] Arrival date & time: 09/15/21  1129     History Chief Complaint  Patient presents with   Leg Pain    Maria Everett is a 55 y.o. female.  HPI Patient is a 55 year old female with past medical history significant for obesity, he is now with hypothyroidism on levothyroxine  Patient is presented to the ER today with complaints of bilateral knee pain for approximately 2 weeks.  No injuries she states that they have gradually became more achy and states that they are worse at the end of the day.  She states that they are worse when she is walking around.  She has been using Aleve and states that she has had some mild short-lived improvement with this.  No Tylenol  She describes the pain as achy and comes and goes seems to be severe at its worst at the end of the day or when she is walking for long distance.  She states that she feels that her right knee will become swollen occasionally.  No fevers chills no redness of knee.  She denies any surgeries or injections of either of her knees.  Past describes some mild low back pain she states it is not currently bothering her states that it is achy and seems to go across her entire low back.  Denies any bowel or bladder incontinence.  No history of IV drug use not a cancer patient.  Not on blood thinners.     Past Medical History:  Diagnosis Date   Asthma    Bronchitis    COPD (chronic obstructive pulmonary disease) (HCC)    Grave's disease    Hypothyroidism    Shortness of breath 10/22/11   "all the time right now"    Patient Active Problem List   Diagnosis Date Noted   Acute asthma exacerbation 07/30/2021   Acute severe exacerbation of asthma 07/29/2021   Acute respiratory failure with hypoxia (HCC) 04/23/2018   COPD exacerbation (HCC) 04/03/2018   Acute respiratory failure (HCC) 06/15/2016   Abnormal ECG 06/15/2016   Acute hyperglycemia 06/15/2016    Hypothyroidism 06/04/2016   Hypokalemia 06/04/2016   Acute asthmatic bronchitis 10/22/2011   Thyroid disease 10/22/2011   Grave's disease     Past Surgical History:  Procedure Laterality Date   ABDOMINAL HYSTERECTOMY  11/2007   ANKLE SURGERY Left    APPENDECTOMY  11/2007   CESAREAN SECTION  1986; 1987; 1993; 1997     OB History   No obstetric history on file.     Family History  Problem Relation Age of Onset   Sarcoidosis Mother     Social History   Tobacco Use   Smoking status: Never   Smokeless tobacco: Never  Vaping Use   Vaping Use: Never used  Substance Use Topics   Alcohol use: No   Drug use: No    Home Medications Prior to Admission medications   Medication Sig Start Date End Date Taking? Authorizing Provider  diclofenac Sodium (VOLTAREN) 1 % GEL Apply 4 g topically 4 (four) times daily. 09/15/21  Yes Cale Decarolis S, PA  albuterol (VENTOLIN HFA) 108 (90 Base) MCG/ACT inhaler Inhale 2 puffs into the lungs every 4 (four) hours as needed for wheezing or shortness of breath. 07/31/21 08/30/21  Zigmund Daniel., MD  fluticasone (FLONASE) 50 MCG/ACT nasal spray Place 1-2 sprays into both nostrils daily. Patient taking differently: Place 1-2 sprays into both nostrils daily as  needed for allergies or rhinitis. 04/07/18   Narda Bonds, MD  fluticasone-salmeterol (ADVAIR) 500-50 MCG/ACT AEPB Inhale 1 puff into the lungs in the morning and at bedtime. 07/31/21 08/30/21  Zigmund Daniel., MD  ipratropium-albuterol (DUONEB) 0.5-2.5 (3) MG/3ML SOLN Take 3 mLs by nebulization every 6 (six) hours as needed. Patient taking differently: Take 3 mLs by nebulization every 6 (six) hours as needed (shortness of breath). 04/25/18   Darlin Drop, DO  levothyroxine (SYNTHROID, LEVOTHROID) 175 MCG tablet Take 175 mcg by mouth daily.     [provider]  montelukast (SINGULAIR) 10 MG tablet Take 1 tablet (10 mg total) by mouth at bedtime. 04/25/18   Darlin Drop, DO   budesonide-formoterol (SYMBICORT) 80-4.5 MCG/ACT inhaler Inhale 2 puffs into the lungs 2 (two) times daily. 10/02/18 01/10/20  Jacalyn Lefevre, MD    Allergies    Patient has no known allergies.  Review of Systems   Review of Systems  Constitutional:  Negative for chills and fever.  HENT:  Negative for congestion.   Eyes:  Negative for pain.  Respiratory:  Negative for cough and shortness of breath.   Cardiovascular:  Negative for chest pain and leg swelling.  Gastrointestinal:  Negative for abdominal pain and vomiting.  Genitourinary:  Negative for dysuria.  Musculoskeletal:  Positive for arthralgias and back pain. Negative for myalgias.  Skin:  Negative for rash.  Neurological:  Negative for dizziness and headaches.   Physical Exam Updated Vital Signs BP 131/80 (BP Location: Right Arm)   Pulse 70   Temp 98.5 F (36.9 C) (Oral)   Resp 18   Ht 5\' 5"  (1.651 m)   Wt 90.7 kg   SpO2 100%   BMI 33.28 kg/m   Physical Exam Vitals and nursing note reviewed.  Constitutional:      General: She is not in acute distress.    Appearance: Normal appearance. She is not ill-appearing.  HENT:     Head: Normocephalic and atraumatic.     Mouth/Throat:     Mouth: Mucous membranes are moist.  Eyes:     General: No scleral icterus.       Right eye: No discharge.        Left eye: No discharge.     Conjunctiva/sclera: Conjunctivae normal.  Pulmonary:     Effort: Pulmonary effort is normal.     Breath sounds: No stridor.  Musculoskeletal:     Comments: Lesions palpation of either hip or either knee.  No ankle tenderness.  Remote surgical scar of left foot from ankle fracture repair.  F ROM bilateral knees.  Negative anterior posterior drawer right knee.  Bilateral DP PT pulses 2+ and symmetric.  Sensation intact in all aspects of feet.  Skin:    General: Skin is warm and dry.  Neurological:     Mental Status: She is alert and oriented to person, place, and time. Mental status is at  baseline.    ED Results / Procedures / Treatments   Labs (all labs ordered are listed, but only abnormal results are displayed) Labs Reviewed - No data to display  EKG None  Radiology No results found.  Procedures Procedures   Medications Ordered in ED Medications - No data to display  ED Course  I have reviewed the triage vital signs and the nursing notes.  Pertinent labs & imaging results that were available during my care of the patient were reviewed by me and considered in my medical decision  making (see chart for details).    MDM Rules/Calculators/A&P                          Patient here with atraumatic bilateral knee pain seems as been achy for the past 2 weeks.  She has been using Aleve with only mild relief.  No trauma low suspicion for fracture she has no bony tenderness full range of motion of bilateral knees negative anterior posterior drawer doubt ligamentous injury and no mechanism to indicate this.  Suspect arthritis less likely to be psoriatic or rheumatologic given lack of history and lack of associated symptoms.  No fevers and no overlying redness or significant swelling and no systemic symptoms overall not concerning for septic otitis.  Show decision-making oversedation about x-rays we will hold off on these as I do not think they are indicated or helpful for this patient.  Recommend follow-up with PCP/orthopedics.  We will provide Ace wrap for right knee as this is what is causing her more symptoms.  Recommend Tylenol 1000 mg every 6 hours ice and Voltaren gel  Ambulatory at time of discharge.  Return precautions given.  Distal pulses intact  Final Clinical Impression(s) / ED Diagnoses Final diagnoses:  Pain in both knees, unspecified chronicity    Rx / DC Orders ED Discharge Orders          Ordered    diclofenac Sodium (VOLTAREN) 1 % GEL  4 times daily        09/15/21 1159             Gailen Shelter, [place] 09/15/21 1205    Virgina Norfolk, DO 09/15/21 1433

## 2022-05-22 ENCOUNTER — Encounter (HOSPITAL_COMMUNITY): Payer: Self-pay

## 2022-05-22 ENCOUNTER — Encounter (HOSPITAL_BASED_OUTPATIENT_CLINIC_OR_DEPARTMENT_OTHER): Payer: Self-pay | Admitting: Emergency Medicine

## 2022-05-22 ENCOUNTER — Inpatient Hospital Stay (HOSPITAL_BASED_OUTPATIENT_CLINIC_OR_DEPARTMENT_OTHER)
Admission: EM | Admit: 2022-05-22 | Discharge: 2022-05-24 | DRG: 203 | Disposition: A | Discharge status: Home / Self Care | Payer: Self-pay | Attending: Internal Medicine | Admitting: Internal Medicine

## 2022-05-22 ENCOUNTER — Other Ambulatory Visit: Payer: Self-pay

## 2022-05-22 ENCOUNTER — Emergency Department (HOSPITAL_BASED_OUTPATIENT_CLINIC_OR_DEPARTMENT_OTHER): Payer: Self-pay

## 2022-05-22 DIAGNOSIS — E0500 Thyrotoxicosis with diffuse goiter without thyrotoxic crisis or storm: Secondary | ICD-10-CM | POA: Diagnosis present

## 2022-05-22 DIAGNOSIS — J4551 Severe persistent asthma with (acute) exacerbation: Principal | ICD-10-CM | POA: Diagnosis present

## 2022-05-22 DIAGNOSIS — Z90710 Acquired absence of both cervix and uterus: Secondary | ICD-10-CM

## 2022-05-22 DIAGNOSIS — J449 Chronic obstructive pulmonary disease, unspecified: Secondary | ICD-10-CM | POA: Diagnosis present

## 2022-05-22 DIAGNOSIS — E039 Hypothyroidism, unspecified: Secondary | ICD-10-CM | POA: Diagnosis present

## 2022-05-22 DIAGNOSIS — E876 Hypokalemia: Secondary | ICD-10-CM | POA: Diagnosis present

## 2022-05-22 DIAGNOSIS — Z79890 Hormone replacement therapy (postmenopausal): Secondary | ICD-10-CM

## 2022-05-22 DIAGNOSIS — J4541 Moderate persistent asthma with (acute) exacerbation: Secondary | ICD-10-CM

## 2022-05-22 DIAGNOSIS — Z20822 Contact with and (suspected) exposure to covid-19: Secondary | ICD-10-CM | POA: Diagnosis present

## 2022-05-22 DIAGNOSIS — Z79899 Other long term (current) drug therapy: Secondary | ICD-10-CM

## 2022-05-22 DIAGNOSIS — J45909 Unspecified asthma, uncomplicated: Secondary | ICD-10-CM | POA: Diagnosis present

## 2022-05-22 DIAGNOSIS — Z7951 Long term (current) use of inhaled steroids: Secondary | ICD-10-CM

## 2022-05-22 LAB — CBC WITH DIFFERENTIAL/PLATELET
Abs Immature Granulocytes: 0.02 10*3/uL (ref 0.00–0.07)
Basophils Absolute: 0 10*3/uL (ref 0.0–0.1)
Basophils Relative: 1 %
Eosinophils Absolute: 0.8 10*3/uL — ABNORMAL HIGH (ref 0.0–0.5)
Eosinophils Relative: 18 %
HCT: 37.5 % (ref 36.0–46.0)
Hemoglobin: 12.1 g/dL (ref 12.0–15.0)
Immature Granulocytes: 1 %
Lymphocytes Relative: 36 %
Lymphs Abs: 1.6 10*3/uL (ref 0.7–4.0)
MCH: 26.5 pg (ref 26.0–34.0)
MCHC: 32.3 g/dL (ref 30.0–36.0)
MCV: 82.1 fL (ref 80.0–100.0)
Monocytes Absolute: 0.3 10*3/uL (ref 0.1–1.0)
Monocytes Relative: 7 %
Neutro Abs: 1.6 10*3/uL — ABNORMAL LOW (ref 1.7–7.7)
Neutrophils Relative %: 37 %
Platelets: 211 10*3/uL (ref 150–400)
RBC: 4.57 MIL/uL (ref 3.87–5.11)
RDW: 13.5 % (ref 11.5–15.5)
WBC: 4.4 10*3/uL (ref 4.0–10.5)
nRBC: 0 % (ref 0.0–0.2)

## 2022-05-22 LAB — BASIC METABOLIC PANEL
Anion gap: 7 (ref 5–15)
BUN: 7 mg/dL (ref 6–20)
CO2: 23 mmol/L (ref 22–32)
Calcium: 9.2 mg/dL (ref 8.9–10.3)
Chloride: 112 mmol/L — ABNORMAL HIGH (ref 98–111)
Creatinine, Ser: 0.68 mg/dL (ref 0.44–1.00)
GFR, Estimated: >60 mL/min (ref >60)
Glucose, Bld: 95 mg/dL (ref 70–99)
Potassium: 3 mmol/L — ABNORMAL LOW (ref 3.5–5.1)
Sodium: 142 mmol/L (ref 135–145)

## 2022-05-22 LAB — MAGNESIUM: Magnesium: 1.8 mg/dL (ref 1.7–2.4)

## 2022-05-22 LAB — TROPONIN I (HIGH SENSITIVITY): Troponin I (High Sensitivity): 5 ng/L (ref <18)

## 2022-05-22 LAB — SARS CORONAVIRUS 2 BY RT PCR: SARS Coronavirus 2 by RT PCR: NEGATIVE

## 2022-05-22 MED ORDER — ALBUTEROL SULFATE (2.5 MG/3ML) 0.083% IN NEBU
10.0000 mg/h | INHALATION_SOLUTION | RESPIRATORY_TRACT | Status: AC
  Administered 2022-05-22: 10 mg/h via RESPIRATORY_TRACT
  Filled 2022-05-22: qty 12

## 2022-05-22 MED ORDER — BUDESONIDE 0.5 MG/2ML IN SUSP
0.2500 mg | Freq: Two times a day (BID) | RESPIRATORY_TRACT | Status: DC
Start: 2022-05-22 — End: 2022-05-24
  Administered 2022-05-22 – 2022-05-24 (×4): 0.25 mg via RESPIRATORY_TRACT
  Filled 2022-05-22 (×4): qty 2

## 2022-05-22 MED ORDER — POTASSIUM CHLORIDE CRYS ER 20 MEQ PO TBCR
40.0000 meq | EXTENDED_RELEASE_TABLET | Freq: Once | ORAL | Status: AC
  Administered 2022-05-22: 40 meq via ORAL
  Filled 2022-05-22: qty 2

## 2022-05-22 MED ORDER — IPRATROPIUM-ALBUTEROL 0.5-2.5 (3) MG/3ML IN SOLN
RESPIRATORY_TRACT | Status: AC
  Administered 2022-05-22: 3 mL via RESPIRATORY_TRACT
  Filled 2022-05-22: qty 3

## 2022-05-22 MED ORDER — AZITHROMYCIN 500 MG PO TABS
500.0000 mg | ORAL_TABLET | Freq: Every day | ORAL | Status: DC
  Administered 2022-05-23 – 2022-05-24 (×2): 500 mg via ORAL
  Filled 2022-05-22 (×2): qty 1

## 2022-05-22 MED ORDER — IPRATROPIUM-ALBUTEROL 0.5-2.5 (3) MG/3ML IN SOLN
3.0000 mL | Freq: Four times a day (QID) | RESPIRATORY_TRACT | Status: DC
  Administered 2022-05-22 – 2022-05-24 (×5): 3 mL via RESPIRATORY_TRACT
  Filled 2022-05-22 (×7): qty 3

## 2022-05-22 MED ORDER — METHYLPREDNISOLONE SODIUM SUCC 125 MG IJ SOLR
80.0000 mg | Freq: Two times a day (BID) | INTRAMUSCULAR | Status: DC
Start: 2022-05-22 — End: 2022-05-23
  Administered 2022-05-22 – 2022-05-23 (×2): 80 mg via INTRAVENOUS
  Filled 2022-05-22 (×2): qty 2

## 2022-05-22 MED ORDER — ONDANSETRON HCL 4 MG/2ML IJ SOLN
4.0000 mg | Freq: Four times a day (QID) | INTRAMUSCULAR | Status: DC | PRN
  Administered 2022-05-22: 4 mg via INTRAVENOUS
  Filled 2022-05-22: qty 2

## 2022-05-22 MED ORDER — FLUTICASONE PROPIONATE 50 MCG/ACT NA SUSP
1.0000 | Freq: Every day | NASAL | Status: DC | PRN
  Administered 2022-05-23: 1 via NASAL
  Filled 2022-05-22: qty 16

## 2022-05-22 MED ORDER — METHYLPREDNISOLONE SODIUM SUCC 125 MG IJ SOLR
125.0000 mg | Freq: Once | INTRAMUSCULAR | Status: AC
  Administered 2022-05-22: 125 mg via INTRAVENOUS
  Filled 2022-05-22: qty 2

## 2022-05-22 MED ORDER — ENOXAPARIN SODIUM 40 MG/0.4ML IJ SOSY
40.0000 mg | PREFILLED_SYRINGE | INTRAMUSCULAR | Status: DC
  Administered 2022-05-22: 40 mg via SUBCUTANEOUS
  Filled 2022-05-22 (×2): qty 0.4

## 2022-05-22 MED ORDER — LEVOTHYROXINE SODIUM 75 MCG PO TABS
175.0000 ug | ORAL_TABLET | Freq: Every day | ORAL | Status: DC
  Administered 2022-05-23: 175 ug via ORAL
  Filled 2022-05-22: qty 1

## 2022-05-22 MED ORDER — IPRATROPIUM-ALBUTEROL 0.5-2.5 (3) MG/3ML IN SOLN: 3.0000 mL | Freq: Once | RESPIRATORY_TRACT | Status: AC

## 2022-05-22 MED ORDER — MAGNESIUM SULFATE 2 GM/50ML IV SOLN
2.0000 g | Freq: Once | INTRAVENOUS | Status: AC
  Administered 2022-05-22: 2 g via INTRAVENOUS
  Filled 2022-05-22: qty 50

## 2022-05-22 MED ORDER — IPRATROPIUM-ALBUTEROL 0.5-2.5 (3) MG/3ML IN SOLN: 3.0000 mL | Freq: Once | RESPIRATORY_TRACT | Status: DC

## 2022-05-22 MED ORDER — DICLOFENAC SODIUM 1 % EX GEL
4.0000 g | Freq: Four times a day (QID) | CUTANEOUS | Status: DC
  Administered 2022-05-22 – 2022-05-24 (×6): 4 g via TOPICAL
  Filled 2022-05-22: qty 100

## 2022-05-22 MED ORDER — ALBUTEROL SULFATE (2.5 MG/3ML) 0.083% IN NEBU: 3.0000 mL | INHALATION_SOLUTION | RESPIRATORY_TRACT | Status: DC | PRN

## 2022-05-22 MED ORDER — MONTELUKAST SODIUM 10 MG PO TABS
10.0000 mg | ORAL_TABLET | Freq: Every day | ORAL | Status: DC
  Administered 2022-05-22 – 2022-05-23 (×2): 10 mg via ORAL
  Filled 2022-05-22 (×2): qty 1

## 2022-05-22 MED ORDER — MOMETASONE FURO-FORMOTEROL FUM 200-5 MCG/ACT IN AERO
2.0000 | INHALATION_SPRAY | Freq: Two times a day (BID) | RESPIRATORY_TRACT | Status: DC
  Administered 2022-05-23: 2 via RESPIRATORY_TRACT
  Filled 2022-05-22: qty 8.8

## 2022-05-22 MED ORDER — POTASSIUM CHLORIDE 10 MEQ/100ML IV SOLN
10.0000 meq | INTRAVENOUS | Status: AC
  Administered 2022-05-22 (×2): 10 meq via INTRAVENOUS
  Filled 2022-05-22: qty 100

## 2022-05-22 MED ORDER — SODIUM CHLORIDE 0.9 % IV SOLN
500.0000 mg | INTRAVENOUS | Status: AC
  Administered 2022-05-22: 500 mg via INTRAVENOUS
  Filled 2022-05-22: qty 5

## 2022-05-22 NOTE — H&P (Signed)
History and Physical    Maria Everett FUX:[id number] DOB: [birth year] DOA: 05/22/2022  PCP: Ranae Pila, FNP (Confirm with patient/family/NH records and if not entered, this has to be entered at [facility] point of entry) Patient coming from: Home  I have personally briefly reviewed patient's old medical records in [facility]  Chief Complaint: Wheezing, cough SOB  HPI: Maria Everett is a 56 y.o. female with medical history significant of mild intermittent asthma, hypothyroidism, presented with worsening of wheezing cough shortness of breath.  Symptoms started last weekend, reportedly, patient called a code with runny nose sore throat and then soon developed wheezing and productive cough with light yellowish sputum, and shortness of breath.  Unfortunately, her nebulizing machine broke down and she has not been using Advair regularly until onset of her breathing symptoms.  She has been using around-the-clock Proventil inhaler and restarting Advair over the last 2 to 3 days no significant improvement and could not sleep at all last night because of breathing symptoms.  No fever chills no chest pains.  ED Course: She was found to be eating acute respiratory distress diffused wheezing and tachypneic and symptoms slightly improved after IV Solu-Medrol and magnesium  Review of Systems: As per HPI otherwise 14 point review of systems negative.    Past Medical History:  Diagnosis Date   Asthma    Bronchitis    COPD (chronic obstructive pulmonary disease) (HCC)    Grave's disease    Hypothyroidism    Shortness of breath 10/22/11   "all the time right now"    Past Surgical History:  Procedure Laterality Date   ABDOMINAL HYSTERECTOMY  11/2007   ANKLE SURGERY Left    APPENDECTOMY  11/2007   CESAREAN SECTION  1986; 1987; 1993; 1997     reports that she has never smoked. She has never used smokeless tobacco. She reports that she does not drink alcohol and does not use drugs.  No Known  Allergies  Family History  Problem Relation Age of Onset   Sarcoidosis Mother     Prior to Admission medications   Medication Sig Start Date End Date Taking? Authorizing Provider  albuterol (VENTOLIN HFA) 108 (90 Base) MCG/ACT inhaler Inhale 2 puffs into the lungs every 4 (four) hours as needed for wheezing or shortness of breath. 07/31/21 08/30/21  Zigmund Daniel., MD  diclofenac Sodium (VOLTAREN) 1 % GEL Apply 4 g topically 4 (four) times daily. 09/15/21   Fondaw, Rodrigo Ran, PA  fluticasone (FLONASE) 50 MCG/ACT nasal spray Place 1-2 sprays into both nostrils daily. Patient taking differently: Place 1-2 sprays into both nostrils daily as needed for allergies or rhinitis. 04/07/18   Narda Bonds, MD  fluticasone-salmeterol (ADVAIR) 500-50 MCG/ACT AEPB Inhale 1 puff into the lungs in the morning and at bedtime. 07/31/21 08/30/21  Zigmund Daniel., MD  ipratropium-albuterol (DUONEB) 0.5-2.5 (3) MG/3ML SOLN Take 3 mLs by nebulization every 6 (six) hours as needed. Patient taking differently: Take 3 mLs by nebulization every 6 (six) hours as needed (shortness of breath). 04/25/18   Darlin Drop, DO  levothyroxine (SYNTHROID, LEVOTHROID) 175 MCG tablet Take 175 mcg by mouth daily.     [provider]  montelukast (SINGULAIR) 10 MG tablet Take 1 tablet (10 mg total) by mouth at bedtime. 04/25/18   Darlin Drop, DO  budesonide-formoterol (SYMBICORT) 80-4.5 MCG/ACT inhaler Inhale 2 puffs into the lungs 2 (two) times daily. 10/02/18 01/10/20  Jacalyn Lefevre, MD    Physical Exam:  Vitals:   05/22/22 1200 05/22/22 1234 05/22/22 1315 05/22/22 1659  BP: 139/84 139/84 126/76 133/86  Pulse: 63 68 84 78  Resp: 18 18 20 19   Temp:  98.8 F (37.1 C)  98 F (36.7 C)  TempSrc:  Axillary  Oral  SpO2: 97% 98% 96% 95%  Weight:    86.1 kg  Height:    5\' 5"  (1.651 m)    Constitutional: NAD, calm, comfortable Vitals:   05/22/22 1200 05/22/22 1234 05/22/22 1315 05/22/22 1659  BP:  139/84 139/84 126/76 133/86  Pulse: 63 68 84 78  Resp: 18 18 20 19   Temp:  98.8 F (37.1 C)  98 F (36.7 C)  TempSrc:  Axillary  Oral  SpO2: 97% 98% 96% 95%  Weight:    86.1 kg  Height:    5\' 5"  (1.651 m)   Eyes: PERRL, lids and conjunctivae normal ENMT: Mucous membranes are moist. Posterior pharynx clear of any exudate or lesions.Normal dentition.  Neck: normal, supple, no masses, no thyromegaly Respiratory: clear to auscultation bilaterally, diffused wheezing and scattered crackles bilaterally.  Increasing respiratory effort. No accessory muscle use.  Cardiovascular: Regular rate and rhythm, no murmurs / rubs / gallops. No extremity edema. 2+ pedal pulses. No carotid bruits.  Abdomen: no tenderness, no masses palpated. No hepatosplenomegaly. Bowel sounds positive.  Musculoskeletal: no clubbing / cyanosis. No joint deformity upper and lower extremities. Good ROM, no contractures. Normal muscle tone.  Skin: no rashes, lesions, ulcers. No induration Neurologic: CN 2-12 grossly intact. Sensation intact, DTR normal. Strength 5/5 in all 4.  Psychiatric: Normal judgment and insight. Alert and oriented x 3. Normal mood.     Labs on Admission: I have personally reviewed following labs and imaging studies  CBC: Recent Labs  Lab 05/22/22 0913  WBC 4.4  NEUTROABS 1.6*  HGB 12.1  HCT 37.5  MCV 82.1  PLT 211   Basic Metabolic Panel: Recent Labs  Lab 05/22/22 0913  NA 142  K 3.0*  CL 112*  CO2 23  GLUCOSE 95  BUN 7  CREATININE 0.68  CALCIUM 9.2  MG 1.8   GFR: Estimated Creatinine Clearance: 87.1 mL/min (by C-G formula based on SCr of 0.68 mg/dL). Liver Function Tests: No results for input(s): "AST", "ALT", "ALKPHOS", "BILITOT", "PROT", "ALBUMIN" in the last 168 hours. No results for input(s): "LIPASE", "AMYLASE" in the last 168 hours. No results for input(s): "AMMONIA" in the last 168 hours. Coagulation Profile: No results for input(s): "INR", "PROTIME" in the last 168  hours. Cardiac Enzymes: No results for input(s): "CKTOTAL", "CKMB", "CKMBINDEX", "TROPONINI" in the last 168 hours. BNP (last 3 results) No results for input(s): "PROBNP" in the last 8760 hours. HbA1C: No results for input(s): "HGBA1C" in the last 72 hours. CBG: No results for input(s): "GLUCAP" in the last 168 hours. Lipid Profile: No results for input(s): "CHOL", "HDL", "LDLCALC", "TRIG", "CHOLHDL", "LDLDIRECT" in the last 72 hours. Thyroid Function Tests: No results for input(s): "TSH", "T4TOTAL", "FREET4", "T3FREE", "THYROIDAB" in the last 72 hours. Anemia Panel: No results for input(s): "VITAMINB12", "FOLATE", "FERRITIN", "TIBC", "IRON", "RETICCTPCT" in the last 72 hours. Urine analysis:    Component Value Date/Time   COLORURINE YELLOW 06/04/2016 1820   APPEARANCEUR CLEAR 06/04/2016 1820   LABSPEC 1.011 06/04/2016 1820   PHURINE 6.0 06/04/2016 1820   GLUCOSEU NEGATIVE 06/04/2016 1820   HGBUR NEGATIVE 06/04/2016 1820   BILIRUBINUR NEGATIVE 06/04/2016 1820   KETONESUR NEGATIVE 06/04/2016 1820   PROTEINUR NEGATIVE 06/04/2016 1820   UROBILINOGEN  1.0 10/24/2011 1154   NITRITE NEGATIVE 06/04/2016 1820   LEUKOCYTESUR NEGATIVE 06/04/2016 1820    Radiological Exams on Admission: DG Chest Portable 1 View  Result Date: 05/22/2022 CLINICAL DATA:  Cough and shortness of breath since Monday. History of asthma. EXAM: PORTABLE CHEST 1 VIEW COMPARISON:  07/29/2021 FINDINGS: The heart size and mediastinal contours are within normal limits. Both lungs are clear. The visualized skeletal structures are unremarkable. IMPRESSION: No active disease. Electronically Signed   By: Norva Pavlov M.D.   On: 05/22/2022 09:02    EKG: Independently reviewed.  Sinus rhythm, no acute ST changes.  Assessment/Plan Principal Problem:   Asthma  (please populate well all problems here in Problem List. (For example, if patient is on BP meds at home and you resume or decide to hold them, it is a problem that  needs to be her. Same for CAD, COPD, HLD and so on)  Acute asthma exacerbation -Failed outpatient management, still having active wheezing and increasing breathing effort, decided to continue IV Solu-Medrol 40 mg every 6 hours bridging for p.o. steroid.  Given there is also signs of acute bronchitis, will give a short course of p.o. azithromycin. -Atypical study and sputum culture sent -Educated patient about importance of compliance with maintenance Advair  Hypokalemia -P.o. and IV replacement, recheck level tomorrow -Magnesium= 1.9.  Hypothyroidism -Continue Synthroid  DVT prophylaxis: Lovenox Code Status: Full code Family Communication: Mother at bedside Disposition Plan: Expect less than 2 midnight hospital stay Consults called: None Admission status: MedSurg observation   Emeline General MD [facility] Pager [phone]  05/22/2022, 6:25 PM

## 2022-05-22 NOTE — Progress Notes (Signed)
Pt arrived around 1645 by [facility] from [facility] via stretcher. Pt alert and oriented x4 in no acute distress upon arrival. Pt amb from stretcher to hospital bed. VSS. Respirations even and unlabored on room air. Bed in low position. Call bell within reach. Pt encouraged to use call bell for assistance.

## 2022-05-22 NOTE — ED Provider Notes (Addendum)
[facility] EMERGENCY DEPARTMENT Provider Note   CSN: [id number] Arrival date & time: 05/22/22  [id number]     History  Chief Complaint  Patient presents with   Cough    sob   Shortness of Breath    Maria Everett is a 56 y.o. female.  HPI 56 y.o. female with medical history significant of chronic intermittent asthma, history of Graves' disease now hypothyroid on supplements.  Patient reports she has had worsening of her asthma.  She reports she thinks she got a cold.  For about 3 days she has had increasing cough.  She reports cough is sometimes productive.  No fevers or chest pain.  Patient reports that she is using her Breo and Proventil.  She reports she is using the Proventil inhaler about every 4 hours.  Breathing has gotten worse.  She is more short of breath.  Patient reports that she does have a nebulizer machine but she does not have any solution for it.  No lower extremity swelling or calf pain.  No abdominal pain nausea or vomiting.    Home Medications Prior to Admission medications   Medication Sig Start Date End Date Taking? Authorizing Provider  albuterol (VENTOLIN HFA) 108 (90 Base) MCG/ACT inhaler Inhale 2 puffs into the lungs every 4 (four) hours as needed for wheezing or shortness of breath. 07/31/21 08/30/21  Elodia Florence., MD  diclofenac Sodium (VOLTAREN) 1 % GEL Apply 4 g topically 4 (four) times daily. 09/15/21   Fondaw, Kathleene Hazel, PA  fluticasone (FLONASE) 50 MCG/ACT nasal spray Place 1-2 sprays into both nostrils daily. Patient taking differently: Place 1-2 sprays into both nostrils daily as needed for allergies or rhinitis. 04/07/18   Mariel Aloe, MD  fluticasone-salmeterol (ADVAIR) 500-50 MCG/ACT AEPB Inhale 1 puff into the lungs in the morning and at bedtime. 07/31/21 08/30/21  Elodia Florence., MD  ipratropium-albuterol (DUONEB) 0.5-2.5 (3) MG/3ML SOLN Take 3 mLs by nebulization every 6 (six) hours as needed. Patient taking  differently: Take 3 mLs by nebulization every 6 (six) hours as needed (shortness of breath). 04/25/18   Kayleen Memos, DO  levothyroxine (SYNTHROID, LEVOTHROID) 175 MCG tablet Take 175 mcg by mouth daily.     [provider]  montelukast (SINGULAIR) 10 MG tablet Take 1 tablet (10 mg total) by mouth at bedtime. 04/25/18   Kayleen Memos, DO  budesonide-formoterol (SYMBICORT) 80-4.5 MCG/ACT inhaler Inhale 2 puffs into the lungs 2 (two) times daily. 10/02/18 01/10/20  Isla Pence, MD      Allergies    Patient has no known allergies.    Review of Systems   Review of Systems 10 systems reviewed negative except as per HPI Physical Exam Updated Vital Signs BP 126/76   Pulse 84   Temp 98.8 F (37.1 C) (Axillary)   Resp 20   Ht 5\' 5"  (1.651 m)   Wt 86.2 kg   SpO2 96%   BMI 31.62 kg/m  Physical Exam Constitutional:      Comments: Mild increased work of breathing at rest.  Patient is speaking in full sentences.  Well-nourished well-developed.  HENT:     Mouth/Throat:     Pharynx: Oropharynx is clear.  Eyes:     Extraocular Movements: Extraocular movements intact.  Cardiovascular:     Rate and Rhythm: Normal rate and regular rhythm.  Pulmonary:     Comments: Mild increased work of breathing at rest.  Speaking in full sentences.  Coarse wheeze throughout  with diminished breath sounds sounds at the bases. Abdominal:     General: There is no distension.     Palpations: Abdomen is soft.     Tenderness: There is no abdominal tenderness. There is no guarding.  Musculoskeletal:        General: No swelling or tenderness. Normal range of motion.     Right lower leg: No edema.     Left lower leg: No edema.  Skin:    General: Skin is warm and dry.  Neurological:     General: No focal deficit present.     Mental Status: She is oriented to person, place, and time.     Motor: No weakness.     Coordination: Coordination normal.  Psychiatric:        Mood and Affect: Mood normal.      ED Results / Procedures / Treatments   Labs (all labs ordered are listed, but only abnormal results are displayed) Labs Reviewed  BASIC METABOLIC PANEL - Abnormal; Notable for the following components:      Result Value   Potassium 3.0 (*)    Chloride 112 (*)    All other components within normal limits  CBC WITH DIFFERENTIAL/PLATELET - Abnormal; Notable for the following components:   Neutro Abs 1.6 (*)    Eosinophils Absolute 0.8 (*)    All other components within normal limits  SARS CORONAVIRUS 2 BY RT PCR  RESP PANEL BY RT-PCR (FLU A&B, COVID) [id number]  MAGNESIUM  TROPONIN I (HIGH SENSITIVITY)  TROPONIN I (HIGH SENSITIVITY)    EKG EKG Interpretation  Date/Time:  Thursday May 22 2022 10:49:46 EDT Ventricular Rate:  61 PR Interval:  140 QRS Duration: 104 QT Interval:  441 QTC Calculation: 445 R Axis:   69 Text Interpretation: Sinus rhythm Abnrm T, consider ischemia, anterolateral lds decreased depth of t wave inversion inferiorly, otherwise no sig change from previous Confirmed by Arby Barrette [phone]) on 05/22/2022 10:58:01 AM  Radiology DG Chest Portable 1 View  Result Date: 05/22/2022 CLINICAL DATA:  Cough and shortness of breath since Monday. History of asthma. EXAM: PORTABLE CHEST 1 VIEW COMPARISON:  07/29/2021 FINDINGS: The heart size and mediastinal contours are within normal limits. Both lungs are clear. The visualized skeletal structures are unremarkable. IMPRESSION: No active disease. Electronically Signed   By: Norva Pavlov M.D.   On: 05/22/2022 09:02    Procedures Procedures   CRITICAL CARE Performed by: Arby Barrette   Total critical care time: 60 minutes  Critical care time was exclusive of separately billable procedures and treating other patients.  Critical care was necessary to treat or prevent imminent or life-threatening deterioration.  Critical care was time spent personally by me on the following activities: development of  treatment plan with patient and/or surrogate as well as nursing, discussions with consultants, evaluation of patient's response to treatment, examination of patient, obtaining history from patient or surrogate, ordering and performing treatments and interventions, ordering and review of laboratory studies, ordering and review of radiographic studies, pulse oximetry and re-evaluation of patient's condition.  Medications Ordered in ED Medications  albuterol (PROVENTIL) (2.5 MG/3ML) 0.083% nebulizer solution (10 mg/hr Nebulization New Bag/Given 05/22/22 1153)  ipratropium-albuterol (DUONEB) 0.5-2.5 (3) MG/3ML nebulizer solution 3 mL (3 mLs Nebulization Given 05/22/22 0844)  methylPREDNISolone sodium succinate (SOLU-MEDROL) 125 mg/2 mL injection 125 mg (125 mg Intravenous Given 05/22/22 0923)  magnesium sulfate IVPB 2 g 50 mL (0 g Intravenous Stopped 05/22/22 1019)  potassium chloride 10 mEq in 100 mL  IVPB (10 mEq Intravenous New Bag/Given 05/22/22 1143)  potassium chloride SA (KLOR-CON M) CR tablet 40 mEq (40 mEq Oral Given 05/22/22 1024)    ED Course/ Medical Decision Making/ A&P                           Medical Decision Making Amount and/or Complexity of Data Reviewed Labs: ordered. Radiology: ordered.  Risk Prescription drug management. Decision regarding hospitalization.   Patient presents with cough, increased work of breathing and wheezing.  She does have history of asthma.  Findings are consistent with asthma exacerbation.  Patient suspects "a cold" is triggering her asthma.  Patient is nontoxic.  Respiratory has started DuoNeb treatments.  When I evaluate patient is on a second DuoNeb.  She reports some improvement but continues to have extensive wheezing.  At this time patient is stable and responding to [facility] therapy, does not appear to need BiPAP.  Will continue with DuoNeb therapy and add Solu-Medrol medium for extensive wheezing with asthma history with prior hospitalization.  10: 00  recheck.  Patient has had 2 DuoNebs.  She feels slightly improved but continues to feel short of breath.  Oxygen saturation is 98% on the monitor.  Rhythm is sinus with T wave inversions.  Patient denies any chest pain.  She does continue to have wheezing to the bases and soft breath sounds at the bases.  Patient's potassium has returned at 3.0.  Will initiate potassium replacement with 2 IV rounds and oral potassium.  Patient is getting magnesium and is got Solu-Medrol.  Will need to continue to observe for improvement  13: 40 patient has been treated with a another hour-long 10 mg albuterol treatment.  She continues to have extensive wheezing and decreased breath sounds to the bases.  Patient can speak in full sentences.  Subjectively she feels slightly better but continues to feel short of breath despite multiple asthma therapies.  Will plan for admission.  Vital signs are stable.  Blood pressures are normal.  Patient continues to saturate in the mid to high 90s.  Heart rate has increased somewhat on treatment up to the low 100s.  Heart rate before multiple treatments with albuterol in the 60s.  13: 53 consult Dr. Chipper Herb for admission [facility]   Final Clinical Impression(s) / ED Diagnoses Final diagnoses:  Severe persistent asthma with exacerbation    Rx / DC Orders ED Discharge Orders     None         Arby Barrette, MD 05/22/22 1343    Arby Barrette, MD 05/22/22 1354

## 2022-05-22 NOTE — ED Notes (Signed)
Cont to have strong non-prod cough, bilateral exp wheezes cont to be present, ED MD aware

## 2022-05-22 NOTE — ED Notes (Signed)
KCL Bag#2 initiated

## 2022-05-22 NOTE — ED Notes (Signed)
RT at bedside.

## 2022-05-22 NOTE — ED Notes (Signed)
States has had a cough, no fever, since Monday.

## 2022-05-22 NOTE — ED Notes (Signed)
ED Provider at bedside. 

## 2022-05-22 NOTE — ED Triage Notes (Signed)
Cough and sob since Monday  hx of asthma

## 2022-05-23 LAB — BASIC METABOLIC PANEL
Anion gap: 4 — ABNORMAL LOW (ref 5–15)
BUN: 6 mg/dL (ref 6–20)
CO2: 21 mmol/L — ABNORMAL LOW (ref 22–32)
Calcium: 9.2 mg/dL (ref 8.9–10.3)
Chloride: 114 mmol/L — ABNORMAL HIGH (ref 98–111)
Creatinine, Ser: 0.76 mg/dL (ref 0.44–1.00)
GFR, Estimated: >60 mL/min (ref >60)
Glucose, Bld: 179 mg/dL — ABNORMAL HIGH (ref 70–99)
Potassium: 3.8 mmol/L (ref 3.5–5.1)
Sodium: 139 mmol/L (ref 135–145)

## 2022-05-23 LAB — RESP PANEL BY RT-PCR (FLU A&B, COVID) ARPGX2
Influenza A by PCR: NEGATIVE
Influenza B by PCR: NEGATIVE
SARS Coronavirus 2 by RT PCR: NEGATIVE

## 2022-05-23 LAB — MYCOPLASMA PNEUMONIAE ANTIBODY, IGM: Mycoplasma pneumo IgM: <770 U/mL (ref 0–769)

## 2022-05-23 LAB — MAGNESIUM: Magnesium: 2.1 mg/dL (ref 1.7–2.4)

## 2022-05-23 MED ORDER — HYDRALAZINE HCL 20 MG/ML IJ SOLN: 10.0000 mg | INTRAMUSCULAR | Status: DC | PRN

## 2022-05-23 MED ORDER — TRAZODONE HCL 50 MG PO TABS: 50.0000 mg | ORAL_TABLET | Freq: Every evening | ORAL | Status: DC | PRN

## 2022-05-23 MED ORDER — LEVOTHYROXINE SODIUM 100 MCG PO TABS
100.0000 ug | ORAL_TABLET | Freq: Every day | ORAL | Status: DC
  Administered 2022-05-24: 100 ug via ORAL
  Filled 2022-05-23: qty 1

## 2022-05-23 MED ORDER — METHYLPREDNISOLONE SODIUM SUCC 40 MG IJ SOLR
40.0000 mg | Freq: Two times a day (BID) | INTRAMUSCULAR | Status: DC
  Administered 2022-05-23 – 2022-05-24 (×2): 40 mg via INTRAVENOUS
  Filled 2022-05-23 (×2): qty 1

## 2022-05-23 MED ORDER — GUAIFENESIN 100 MG/5ML PO LIQD: 5.0000 mL | ORAL | Status: DC | PRN

## 2022-05-23 MED ORDER — ACETAMINOPHEN 325 MG PO TABS: 650.0000 mg | ORAL_TABLET | Freq: Four times a day (QID) | ORAL | Status: DC | PRN

## 2022-05-23 MED ORDER — SENNOSIDES-DOCUSATE SODIUM 8.6-50 MG PO TABS: 1.0000 | ORAL_TABLET | Freq: Every evening | ORAL | Status: DC | PRN

## 2022-05-23 NOTE — Progress Notes (Signed)
PROGRESS NOTE    Maria Everett  DPO:[id number] DOB: [birth year] DOA: 05/22/2022 PCP: Ranae Pila, FNP   Brief Narrative:  56 year old with history of asthma, hypothyroidism admitted for shortness of breath and cough.  Patient was diagnosed with asthma exacerbation and admitted to the hospital.   Assessment & Plan:  Principal Problem:   Asthma    Acute asthma exacerbation - Currently patient on IV steroids, scheduled and as needed bronchodilators.  I-S/flutter valve.  Short course of p.o. azithromycin added.  Slowly ambulate, out of bed to chair.    Hypokalemia - As needed repletion  Hypothyroidism - Synthroid    DVT prophylaxis: Lovenox Code Status: Full Family Communication: Family at bedside  Patient still has significant wheezing.  Maintain hospital stay   Subjective: Feels ok, still tight in her chest but improved since yesterday.  Tells her she ran out of her meds at home. Her machine is ok.    Examination:  General exam: Appears calm and comfortable  Respiratory system: b/l diffuse diminished BS Cardiovascular system: S1 & S2 heard, RRR. No JVD, murmurs, rubs, gallops or clicks. No pedal edema. Gastrointestinal system: Abdomen is nondistended, soft and nontender. No organomegaly or masses felt. Normal bowel sounds heard. Central nervous system: Alert and oriented. No focal neurological deficits. Extremities: Symmetric 5 x 5 power. Skin: No rashes, lesions or ulcers Psychiatry: Judgement and insight appear normal. Mood & affect appropriate.     Objective: Vitals:   05/23/22 0825 05/23/22 0829 05/23/22 0830 05/23/22 0831  BP:  109/74    Pulse:  67    Resp:  16    Temp:  97.6 F (36.4 C)    TempSrc:  Oral    SpO2: 93% 95% 95% 94%  Weight:      Height:        Intake/Output Summary (Last 24 hours) at 05/23/2022 0840 Last data filed at 05/22/2022 1700 Gross per 24 hour  Intake 178.23 ml  Output --  Net 178.23 ml   Filed Weights   05/22/22 0830  05/22/22 1659  Weight: 86.2 kg 86.1 kg     Data Reviewed:   CBC: Recent Labs  Lab 05/22/22 0913  WBC 4.4  NEUTROABS 1.6*  HGB 12.1  HCT 37.5  MCV 82.1  PLT 211   Basic Metabolic Panel: Recent Labs  Lab 05/22/22 0913 05/23/22 0316  NA 142 139  K 3.0* 3.8  CL 112* 114*  CO2 23 21*  GLUCOSE 95 179*  BUN 7 6  CREATININE 0.68 0.76  CALCIUM 9.2 9.2  MG 1.8 2.1   GFR: Estimated Creatinine Clearance: 87.1 mL/min (by C-G formula based on SCr of 0.76 mg/dL). Liver Function Tests: No results for input(s): "AST", "ALT", "ALKPHOS", "BILITOT", "PROT", "ALBUMIN" in the last 168 hours. No results for input(s): "LIPASE", "AMYLASE" in the last 168 hours. No results for input(s): "AMMONIA" in the last 168 hours. Coagulation Profile: No results for input(s): "INR", "PROTIME" in the last 168 hours. Cardiac Enzymes: No results for input(s): "CKTOTAL", "CKMB", "CKMBINDEX", "TROPONINI" in the last 168 hours. BNP (last 3 results) No results for input(s): "PROBNP" in the last 8760 hours. HbA1C: No results for input(s): "HGBA1C" in the last 72 hours. CBG: No results for input(s): "GLUCAP" in the last 168 hours. Lipid Profile: No results for input(s): "CHOL", "HDL", "LDLCALC", "TRIG", "CHOLHDL", "LDLDIRECT" in the last 72 hours. Thyroid Function Tests: No results for input(s): "TSH", "T4TOTAL", "FREET4", "T3FREE", "THYROIDAB" in the last 72 hours. Anemia Panel: No results for input(s): "  VITAMINB12", "FOLATE", "FERRITIN", "TIBC", "IRON", "RETICCTPCT" in the last 72 hours. Sepsis Labs: No results for input(s): "PROCALCITON", "LATICACIDVEN" in the last 168 hours.  Recent Results (from the past 240 hour(s))  Resp Panel by RT-PCR (Flu A&B, Covid) Anterior Nasal Swab     Status: None   Collection Time: 05/22/22  5:32 AM   Specimen: Anterior Nasal Swab  Result Value Ref Range Status   SARS Coronavirus 2 by RT PCR NEGATIVE NEGATIVE Final    Comment: (NOTE) SARS-CoV-2 target nucleic acids  are NOT DETECTED.  The SARS-CoV-2 RNA is generally detectable in upper respiratory specimens during the acute phase of infection. The lowest concentration of SARS-CoV-2 viral copies this assay can detect is 138 copies/mL. A negative result does not preclude SARS-Cov-2 infection and should not be used as the sole basis for treatment or other patient management decisions. A negative result may occur with  improper specimen collection/handling, submission of specimen other than nasopharyngeal swab, presence of viral mutation(s) within the areas targeted by this assay, and inadequate number of viral copies(<138 copies/mL). A negative result must be combined with clinical observations, patient history, and epidemiological information. The expected result is Negative.  Fact Sheet for Patients:  BloggerCourse.com  Fact Sheet for Healthcare Providers:  SeriousBroker.it  This test is no t yet approved or cleared by the [place] FDA and  has been authorized for detection and/or diagnosis of SARS-CoV-2 by FDA under an Emergency Use Authorization (EUA). This EUA will remain  in effect (meaning this test can be used) for the duration of the COVID-19 declaration under Section 564(b)(1) of the Act, 21 U.S.C.section 360bbb-3(b)(1), unless the authorization is terminated  or revoked sooner.       Influenza A by PCR NEGATIVE NEGATIVE Final   Influenza B by PCR NEGATIVE NEGATIVE Final    Comment: (NOTE) The Xpert Xpress SARS-CoV-2/FLU/RSV plus assay is intended as an aid in the diagnosis of influenza from Nasopharyngeal swab specimens and should not be used as a sole basis for treatment. Nasal washings and aspirates are unacceptable for Xpert Xpress SARS-CoV-2/FLU/RSV testing.  Fact Sheet for Patients: BloggerCourse.com  Fact Sheet for Healthcare Providers: SeriousBroker.it  This test is  not yet approved or cleared by the [place] FDA and has been authorized for detection and/or diagnosis of SARS-CoV-2 by FDA under an Emergency Use Authorization (EUA). This EUA will remain in effect (meaning this test can be used) for the duration of the COVID-19 declaration under Section 564(b)(1) of the Act, 21 U.S.C. section 360bbb-3(b)(1), unless the authorization is terminated or revoked.  Performed at [facility], [address] [place], [place] [address]   SARS Coronavirus 2 by RT PCR (hospital order, performed in [facility] hospital lab) *cepheid single result test* Anterior Nasal Swab     Status: None   Collection Time: 05/22/22  8:35 AM   Specimen: Anterior Nasal Swab  Result Value Ref Range Status   SARS Coronavirus 2 by RT PCR NEGATIVE NEGATIVE Final    Comment: (NOTE) SARS-CoV-2 target nucleic acids are NOT DETECTED.  The SARS-CoV-2 RNA is generally detectable in upper and lower respiratory specimens during the acute phase of infection. The lowest concentration of SARS-CoV-2 viral copies this assay can detect is 250 copies / mL. A negative result does not preclude SARS-CoV-2 infection and should not be used as the sole basis for treatment or other patient management decisions.  A negative result may occur with improper specimen collection / handling, submission of  specimen other than nasopharyngeal swab, presence of viral mutation(s) within the areas targeted by this assay, and inadequate number of viral copies (<250 copies / mL). A negative result must be combined with clinical observations, patient history, and epidemiological information.  Fact Sheet for Patients:   RoadLapTop.co.za  Fact Sheet for Healthcare Providers: http://kim-miller.com/  This test is not yet approved or  cleared by the [place] FDA and has been authorized for detection and/or diagnosis of SARS-CoV-2 by FDA under an  Emergency Use Authorization (EUA).  This EUA will remain in effect (meaning this test can be used) for the duration of the COVID-19 declaration under Section 564(b)(1) of the Act, 21 U.S.C. section 360bbb-3(b)(1), unless the authorization is terminated or revoked sooner.  Performed at [facility], [address] [place], [place] [address]          Radiology Studies: DG Chest Portable 1 View  Result Date: 05/22/2022 CLINICAL DATA:  Cough and shortness of breath since Monday. History of asthma. EXAM: PORTABLE CHEST 1 VIEW COMPARISON:  07/29/2021 FINDINGS: The heart size and mediastinal contours are within normal limits. Both lungs are clear. The visualized skeletal structures are unremarkable. IMPRESSION: No active disease. Electronically Signed   By: Norva Pavlov M.D.   On: 05/22/2022 09:02        Scheduled Meds:  azithromycin  500 mg Oral Daily   budesonide (PULMICORT) nebulizer solution  0.25 mg Nebulization BID   diclofenac Sodium  4 g Topical QID   enoxaparin (LOVENOX) injection  40 mg Subcutaneous Q24H   ipratropium-albuterol  3 mL Nebulization Q6H   levothyroxine  175 mcg Oral Daily   methylPREDNISolone (SOLU-MEDROL) injection  80 mg Intravenous Q12H   montelukast  10 mg Oral QHS   Continuous Infusions:   LOS: 0 days   Time spent= 35 mins    Jenae Tomasello Joline Maxcy, MD [facility]  If 7PM-7AM, please contact night-coverage  05/23/2022, 8:40 AM

## 2022-05-23 NOTE — TOC Initial Note (Signed)
Transition of Care [facility]) - Initial/Assessment Note    Patient Details  Name: Maria Everett MRN: [id number] Date of Birth: 1966-01-10  [facility] CM/SW Contact:    Harriet Masson, RN Phone Number: 05/23/2022, 10:20 AM  Clinical Narrative:                 Spoke to patient regarding broken nebulizer. Patient states that information isn't correct. Patient has a working nebulizer machine at home.  Patient also states she has [facility] insurance but left her card at home. TOC will continue to follow for needs. Expected Discharge Plan: Home/Self Care     Patient Goals and CMS Choice Patient states their goals for this hospitalization and ongoing recovery are:: return home      Expected Discharge Plan and Services Expected Discharge Plan: Home/Self Care                                              Prior Living Arrangements/Services     Patient language and need for interpreter reviewed:: Yes Do you feel safe going back to the place where you live?: Yes      Need for Family Participation in Patient Care: Yes (Comment) Care giver support system in place?: Yes (comment)   Criminal Activity/Legal Involvement Pertinent to Current Situation/Hospitalization: No - Comment as needed  Activities of Daily Living Home Assistive Devices/Equipment: None ADL Screening (condition at time of admission) Patient's cognitive ability adequate to safely complete daily activities?: Yes Is the patient deaf or have difficulty hearing?: No Does the patient have difficulty seeing, even when wearing glasses/contacts?: No Does the patient have difficulty concentrating, remembering, or making decisions?: No Patient able to express need for assistance with ADLs?: Yes Does the patient have difficulty dressing or bathing?: No Independently performs ADLs?: Yes (appropriate for developmental age) Does the patient have difficulty walking or climbing stairs?: No Weakness of Legs:  None Weakness of Arms/Hands: None  Permission Sought/Granted                  Emotional Assessment Appearance:: Appears stated age Attitude/Demeanor/Rapport: Engaged Affect (typically observed): Accepting Orientation: : Oriented to Self, Oriented to Place, Oriented to  Time, Oriented to Situation Alcohol / Substance Use: Not Applicable Psych Involvement: No (comment)  Admission diagnosis:  Severe persistent asthma with exacerbation [J45.51] Asthma [J45.909] Patient Active Problem List   Diagnosis Date Noted   Asthma 05/22/2022   Acute asthma exacerbation 07/30/2021   Acute severe exacerbation of asthma 07/29/2021   Acute respiratory failure with hypoxia (HCC) 04/23/2018   COPD exacerbation (HCC) 04/03/2018   Acute respiratory failure (HCC) 06/15/2016   Abnormal ECG 06/15/2016   Acute hyperglycemia 06/15/2016   Hypothyroidism 06/04/2016   Hypokalemia 06/04/2016   Acute asthmatic bronchitis 10/22/2011   Thyroid disease 10/22/2011   Grave's disease    PCP:  Ranae Pila, FNP Pharmacy:   [id number] - [place], [place] - [address] AT [address]OSS [address] [place] [address] Phone: [phone] Fax: [phone]  [id number] - [place], [place] - [address] AT [facility] [address] [place] [address] Phone: [phone] Fax: [phone]     Social Determinants of Health (SDOH) Interventions    Readmission Risk Interventions     No data to  display

## 2022-05-24 LAB — BASIC METABOLIC PANEL
Anion gap: 6 (ref 5–15)
BUN: 10 mg/dL (ref 6–20)
CO2: 23 mmol/L (ref 22–32)
Calcium: 9.4 mg/dL (ref 8.9–10.3)
Chloride: 113 mmol/L — ABNORMAL HIGH (ref 98–111)
Creatinine, Ser: 0.82 mg/dL (ref 0.44–1.00)
GFR, Estimated: >60 mL/min (ref >60)
Glucose, Bld: 128 mg/dL — ABNORMAL HIGH (ref 70–99)
Potassium: 4.7 mmol/L (ref 3.5–5.1)
Sodium: 142 mmol/L (ref 135–145)

## 2022-05-24 LAB — MAGNESIUM: Magnesium: 2.3 mg/dL (ref 1.7–2.4)

## 2022-05-24 LAB — BRAIN NATRIURETIC PEPTIDE: B Natriuretic Peptide: 67.7 pg/mL (ref 0.0–100.0)

## 2022-05-24 MED ORDER — PREDNISONE 5 MG PO TABS: ORAL_TABLET | ORAL | 0 refills | Status: AC

## 2022-05-24 MED ORDER — ALBUTEROL SULFATE HFA 108 (90 BASE) MCG/ACT IN AERS: 2.0000 | INHALATION_SPRAY | RESPIRATORY_TRACT | 0 refills | Status: AC | PRN

## 2022-05-24 MED ORDER — IPRATROPIUM-ALBUTEROL 0.5-2.5 (3) MG/3ML IN SOLN: 3.0000 mL | Freq: Four times a day (QID) | RESPIRATORY_TRACT | 0 refills | Status: AC | PRN

## 2022-05-24 MED ORDER — IPRATROPIUM-ALBUTEROL 0.5-2.5 (3) MG/3ML IN SOLN
3.0000 mL | Freq: Three times a day (TID) | RESPIRATORY_TRACT | Status: DC
Start: 2022-05-24 — End: 2022-05-24

## 2022-05-24 MED ORDER — FLUTICASONE-SALMETEROL 500-50 MCG/ACT IN AEPB
1.0000 | INHALATION_SPRAY | Freq: Two times a day (BID) | RESPIRATORY_TRACT | 0 refills | Status: AC
Start: 2022-05-24 — End: 2022-06-23

## 2022-05-24 NOTE — Progress Notes (Signed)
Maria Everett to be D/C'd Home per MD order.  Discussed with the patient and all questions fully answered.  VSS, Skin clean, dry and intact without evidence of skin break down, no evidence of skin tears noted. IV catheter discontinued intact. Site without signs and symptoms of complications. Dressing and pressure applied.  An After Visit Summary was printed and given to the patient. Patient received prescription.  D/c education completed with patient/family including follow up instructions, medication list, d/c activities limitations if indicated, with other d/c instructions as indicated by MD - patient able to verbalize understanding, all questions fully answered.   Patient instructed to return to ED, call 911, or call MD for any changes in condition.   Patient escorted via WC, and D/C home via private auto.  [name] 05/24/2022 11:01 AM

## 2022-05-24 NOTE — Discharge Summary (Signed)
Maria Everett EHM:[id number] DOB: [birth year] DOA: 05/22/2022  PCP: Ranae Pila, FNP  Admit date: 05/22/2022  Discharge date: 05/24/2022  Admitted From: Home   Disposition:  Home   Recommendations for Outpatient Follow-up:   Follow up with PCP in 1-2 weeks  PCP Please obtain BMP/CBC, 2 view CXR in 1week,  (see Discharge instructions)   PCP Please follow up on the following pending results:    Home Health: None   Equipment/Devices: None  Consultations: None  Discharge Condition: Stable    CODE STATUS: Full    Diet Recommendation: Heart Healthy   Diet Order             Diet - low sodium heart healthy           Diet regular Room service appropriate? Yes; Fluid consistency: Thin  Diet effective now                    Chief Complaint  Patient presents with   Cough    sob   Shortness of Breath     Brief history of present illness from the day of admission and additional interim summary    56 year old with history of asthma, hypothyroidism admitted for shortness of breath and cough.  Patient was diagnosed with asthma exacerbation and admitted to the hospital.                                                                  Hospital Course    Acute on chronic asthma exacerbation  - she was treated here with IV steroids, much improved symptom-free on room air with minimal wheezing, continue home nebulizer treatments and rescue inhaler as before, continue Singulair and Advair inhalation, placed on oral steroid taper, will be discharged home with outpatient PCP follow-up.  PCP may consider one-time outpatient pulmonary evaluation.   Hypokalemia - As needed repletion   Hypothyroidism - Synthroid   Discharge diagnosis     Principal Problem:   Asthma    Discharge instructions    Discharge  Instructions     Diet - low sodium heart healthy   Complete by: As directed    Discharge instructions   Complete by: As directed    Follow with Primary MD Ranae Pila, FNP in 7 days   Get CBC, CMP, 2 view Chest X ray -  checked next visit within 1 week by Primary MD   Activity: As tolerated with Full fall precautions use walker/cane & assistance as needed  Disposition Home   Diet: Heart Healthy    Special Instructions: If you have smoked or chewed Tobacco  in the last 2 yrs please stop smoking, stop any regular Alcohol  and or any Recreational drug use.  On your next visit with your primary care physician please Get  Medicines reviewed and adjusted.  Please request your Prim.MD to go over all Hospital Tests and Procedure/Radiological results at the follow up, please get all Hospital records sent to your Prim MD by signing hospital release before you go home.  If you experience worsening of your admission symptoms, develop shortness of breath, life threatening emergency, suicidal or homicidal thoughts you must seek medical attention immediately by calling 911 or calling your MD immediately  if symptoms less severe.  You Must read complete instructions/literature along with all the possible adverse reactions/side effects for all the Medicines you take and that have been prescribed to you. Take any new Medicines after you have completely understood and accpet all the possible adverse reactions/side effects.   Increase activity slowly   Complete by: As directed        Discharge Medications   Allergies as of 05/24/2022   No Known Allergies      Medication List     TAKE these medications    albuterol 108 (90 Base) MCG/ACT inhaler Commonly known as: VENTOLIN HFA Inhale 2 puffs into the lungs every 4 (four) hours as needed for wheezing or shortness of breath.   diclofenac Sodium 1 % Gel Commonly known as: Voltaren Apply 4 g topically 4 (four) times daily. What changed: when to  take this   fluticasone 50 MCG/ACT nasal spray Commonly known as: FLONASE Place 1-2 sprays into both nostrils daily. What changed:  when to take this reasons to take this   fluticasone-salmeterol 500-50 MCG/ACT Aepb Commonly known as: ADVAIR Inhale 1 puff into the lungs in the morning and at bedtime.   ipratropium-albuterol 0.5-2.5 (3) MG/3ML Soln Commonly known as: DUONEB Take 3 mLs by nebulization every 6 (six) hours as needed. What changed: reasons to take this   levothyroxine 100 MCG tablet Commonly known as: SYNTHROID Take 100 mcg by mouth daily before breakfast.   montelukast 10 MG tablet Commonly known as: SINGULAIR Take 1 tablet (10 mg total) by mouth at bedtime.   predniSONE 5 MG tablet Commonly known as: DELTASONE Label  & dispense according to the schedule below. take 8 Pills PO for 3 days, 6 Pills PO for 3 days, 4 Pills PO for 3 days, 2 Pills PO for 3 days, 1 Pills PO for 3 days, 1/2 Pill  PO for 3 days then STOP. Total 65 pills.         Follow-up Information     Ranae Pila, FNP. Schedule an appointment as soon as possible for a visit in 1 week(s).   Specialty: Internal Medicine Contact information: [address] [place] [address] [phone]                 Major procedures and Radiology Reports - PLEASE review detailed and final reports thoroughly  -       DG Chest Portable 1 View  Result Date: 05/22/2022 CLINICAL DATA:  Cough and shortness of breath since Monday. History of asthma. EXAM: PORTABLE CHEST 1 VIEW COMPARISON:  07/29/2021 FINDINGS: The heart size and mediastinal contours are within normal limits. Both lungs are clear. The visualized skeletal structures are unremarkable. IMPRESSION: No active disease. Electronically Signed   By: Norva Pavlov M.D.   On: 05/22/2022 09:02    Today   Subjective    Maria Everett today has no headache,no chest abdominal pain,no new weakness tingling or numbness, feels much  better wants to go home today.     Objective   Blood pressure  111/61, pulse 74, temperature 97.6 F (36.4 C), temperature source Oral, resp. rate 16, height 5\' 5"  (1.651 m), weight 86.1 kg, SpO2 93 %.  No intake or output data in the 24 hours ending 05/24/22 1014  Exam  Awake Alert, No new F.N deficits,    [place].AT,PERRAL Supple Neck,   Symmetrical Chest wall movement, Good air movement bilaterally, minimal wheezing RRR,No Gallops,   +ve B.Sounds, Abd Soft, Non tender,  No Cyanosis, Clubbing or edema    Data Review   Recent Labs  Lab 05/22/22 0913  WBC 4.4  HGB 12.1  HCT 37.5  PLT 211  MCV 82.1  MCH 26.5  MCHC 32.3  RDW 13.5  LYMPHSABS 1.6  MONOABS 0.3  EOSABS 0.8*  BASOSABS 0.0    Recent Labs  Lab 05/22/22 0913 05/23/22 0316 05/24/22 0321  NA 142 139 142  K 3.0* 3.8 4.7  CL 112* 114* 113*  CO2 23 21* 23  GLUCOSE 95 179* 128*  BUN 7 6 10   CREATININE 0.68 0.76 0.82  CALCIUM 9.2 9.2 9.4  MG 1.8 2.1 2.3  BNP  --   --  67.7    Total Time in preparing paper work, data evaluation and todays exam - 35 minutes  07/24/22 M.D on 05/24/2022 at 10:14 AM  [facility]

## 2022-05-24 NOTE — Discharge Instructions (Signed)
Follow with Primary MD Ranae Pila, FNP in 7 days   Get CBC, CMP, 2 view Chest X ray -  checked next visit within 1 week by Primary MD   Activity: As tolerated with Full fall precautions use walker/cane & assistance as needed  Disposition Home   Diet: Heart Healthy    Special Instructions: If you have smoked or chewed Tobacco  in the last 2 yrs please stop smoking, stop any regular Alcohol  and or any Recreational drug use.  On your next visit with your primary care physician please Get Medicines reviewed and adjusted.  Please request your Prim.MD to go over all Hospital Tests and Procedure/Radiological results at the follow up, please get all Hospital records sent to your Prim MD by signing hospital release before you go home.  If you experience worsening of your admission symptoms, develop shortness of breath, life threatening emergency, suicidal or homicidal thoughts you must seek medical attention immediately by calling 911 or calling your MD immediately  if symptoms less severe.  You Must read complete instructions/literature along with all the possible adverse reactions/side effects for all the Medicines you take and that have been prescribed to you. Take any new Medicines after you have completely understood and accpet all the possible adverse reactions/side effects.

## 2023-11-30 ENCOUNTER — Encounter (HOSPITAL_BASED_OUTPATIENT_CLINIC_OR_DEPARTMENT_OTHER): Payer: Self-pay | Admitting: Emergency Medicine

## 2023-11-30 ENCOUNTER — Other Ambulatory Visit: Payer: Self-pay

## 2023-11-30 ENCOUNTER — Emergency Department (HOSPITAL_BASED_OUTPATIENT_CLINIC_OR_DEPARTMENT_OTHER): Payer: Self-pay

## 2023-11-30 DIAGNOSIS — J449 Chronic obstructive pulmonary disease, unspecified: Secondary | ICD-10-CM | POA: Diagnosis not present

## 2023-11-30 DIAGNOSIS — E039 Hypothyroidism, unspecified: Secondary | ICD-10-CM | POA: Diagnosis not present

## 2023-11-30 DIAGNOSIS — J45901 Unspecified asthma with (acute) exacerbation: Secondary | ICD-10-CM | POA: Insufficient documentation

## 2023-11-30 DIAGNOSIS — R059 Cough, unspecified: Secondary | ICD-10-CM | POA: Diagnosis present

## 2023-11-30 DIAGNOSIS — J069 Acute upper respiratory infection, unspecified: Secondary | ICD-10-CM | POA: Diagnosis not present

## 2023-11-30 LAB — RESP PANEL BY RT-PCR (RSV, FLU A&B, COVID)  RVPGX2
Influenza A by PCR: NEGATIVE
Influenza B by PCR: NEGATIVE
Resp Syncytial Virus by PCR: NEGATIVE
SARS Coronavirus 2 by RT PCR: NEGATIVE

## 2023-11-30 NOTE — ED Triage Notes (Signed)
 Pt to ED from home c/o cough and body aches since last night.  Hx asthma and using nebulizer at home without relief.

## 2023-12-01 ENCOUNTER — Emergency Department (HOSPITAL_BASED_OUTPATIENT_CLINIC_OR_DEPARTMENT_OTHER)
Admission: EM | Admit: 2023-12-01 | Discharge: 2023-12-01 | Disposition: A | Discharge status: Home / Self Care | Payer: Self-pay | Attending: Emergency Medicine | Admitting: Emergency Medicine

## 2023-12-01 DIAGNOSIS — J45901 Unspecified asthma with (acute) exacerbation: Secondary | ICD-10-CM

## 2023-12-01 DIAGNOSIS — J069 Acute upper respiratory infection, unspecified: Secondary | ICD-10-CM

## 2023-12-01 MED ORDER — ALBUTEROL SULFATE HFA 108 (90 BASE) MCG/ACT IN AERS
2.0000 | INHALATION_SPRAY | Freq: Once | RESPIRATORY_TRACT | Status: AC
  Administered 2023-12-01: 2 via RESPIRATORY_TRACT
  Filled 2023-12-01: qty 6.7

## 2023-12-01 MED ORDER — DEXAMETHASONE SODIUM PHOSPHATE 10 MG/ML IJ SOLN
10.0000 mg | Freq: Once | INTRAMUSCULAR | Status: AC
  Administered 2023-12-01: 10 mg via INTRAMUSCULAR
  Filled 2023-12-01: qty 1

## 2023-12-01 NOTE — ED Provider Notes (Signed)
 [facility] Emergency Department Provider Note MRN:  [id number]  Arrival date & time: 12/01/23     Chief Complaint   Cough and Generalized Body Aches   History of Present Illness   Maria Everett is a 58 y.o. year-old female with a history of COPD/asthma presenting to the ED with chief complaint of cough and bodyaches.  Cough, body aches, sneezing, feeling wheezing and tightness today, mild shortness of breath.  No chest pain.  Review of Systems  A thorough review of systems was obtained and all systems are negative except as noted in the HPI and PMH.   Patient's Health History    Past Medical History:  Diagnosis Date   Asthma    Bronchitis    COPD (chronic obstructive pulmonary disease) (HCC)    Grave's disease    Hypothyroidism    Shortness of breath 10/22/11   "all the time right now"    Past Surgical History:  Procedure Laterality Date   ABDOMINAL HYSTERECTOMY  11/2007   ANKLE SURGERY Left    APPENDECTOMY  11/2007   CESAREAN SECTION  1986; 1987; 1993; 1997    Family History  Problem Relation Age of Onset   Sarcoidosis Mother     Social History   Socioeconomic History   Marital status: Married    Spouse name: Not on file   Number of children: Not on file   Years of education: Not on file   Highest education level: Not on file  Occupational History   Not on file  Tobacco Use   Smoking status: Never   Smokeless tobacco: Never  Vaping Use   Vaping status: Never Used  Substance and Sexual Activity   Alcohol use: No   Drug use: No   Sexual activity: Not Currently    Birth control/protection: Surgical  Other Topics Concern   Not on file  Social History Narrative   Not on file   Social Drivers of Health   Financial Resource Strain: Not on file  Food Insecurity: Not on file  Transportation Needs: Not on file  Physical Activity: Not on file  Stress: Not on file  Social Connections: Not on file  Intimate Partner Violence: Not  on file     Physical Exam   Vitals:   11/30/23 2048 12/01/23 0130  BP: (!) 140/86   Pulse: 65   Resp: 16   Temp: 97.8 F (36.6 C)   SpO2: 100% 98%    CONSTITUTIONAL: Well-appearing, NAD NEURO/PSYCH:  Alert and oriented x 3, no focal deficits EYES:  eyes equal and reactive ENT/NECK:  no LAD, no JVD CARDIO: Regular rate, well-perfused, normal S1 and S2 PULM:  CTAB no wheezing or rhonchi GI/GU:  non-distended, non-tender MSK/SPINE:  No gross deformities, no edema SKIN:  no rash, atraumatic   *Additional and/or pertinent findings included in MDM below  Diagnostic and Interventional Summary    EKG Interpretation Date/Time:    Ventricular Rate:    PR Interval:    QRS Duration:    QT Interval:    QTC Calculation:   R Axis:      Text Interpretation:         Labs Reviewed  RESP PANEL BY RT-PCR (RSV, FLU A&B, COVID)  [id number]    DG Chest 2 View  Final Result      Medications  albuterol (VENTOLIN HFA) 108 (90 Base) MCG/ACT inhaler 2 puff (2 puffs Inhalation Given 12/01/23 0128)  dexamethasone (DECADRON) injection 10 mg (10 mg  Intramuscular Given 12/01/23 0135)     Procedures  /  Critical Care Procedures  ED Course and Medical Decision Making  Initial Impression and Ddx Differential diagnosis includes asthma exacerbation, viral illness, flu, less likely pneumonia, pneumothorax.  Past medical/surgical history that increases complexity of ED encounter: Asthma/COPD  Interpretation of Diagnostics I personally reviewed the Chest Xray and my interpretation is as follows: No lobar opacity or pneumothorax  COVID flu and RSV negative  Patient Reassessment and Ultimate Disposition/Management     Given that patient is with normal vital signs in no acute distress and there is little diagnostic uncertainty, patient is appropriate for discharge with asthma exacerbation treatment.  She prefers IM Decadron.  Has inhalers at home.  Patient management required discussion with  the following services or consulting groups:  None  Complexity of Problems Addressed Acute illness or injury that poses threat of life of bodily function  Additional Data Reviewed and Analyzed Further history obtained from: Past medical history and medications listed in the EMR  Additional Factors Impacting ED Encounter Risk Prescriptions  Elmer Sow. Pilar Plate, MD [facility] Emergency Medicine [facility] [email or web address]  Final Clinical Impressions(s) / ED Diagnoses     ICD-10-CM   1. Upper respiratory tract infection, unspecified type  J06.9     2. Mild asthma with exacerbation, unspecified whether persistent  J45.901       ED Discharge Orders     None        Discharge Instructions Discussed with and Provided to Patient:    Discharge Instructions      You were evaluated in the Emergency Department and after careful evaluation, we did not find any emergent condition requiring admission or further testing in the hospital.  Your exam/testing today is overall reassuring.  Symptoms likely due to a viral illness causing a flare of your asthma.  Use the inhaler at home every 4 hours as needed.  Follow-up with your regular doctor.  Please return to the Emergency Department if you experience any worsening of your condition.   Thank you for allowing [place] to be a part of your care.      Sabas Sous, MD 12/01/23 [phone]

## 2023-12-01 NOTE — Discharge Instructions (Signed)
 You were evaluated in the Emergency Department and after careful evaluation, we did not find any emergent condition requiring admission or further testing in the hospital.  Your exam/testing today is overall reassuring.  Symptoms likely due to a viral illness causing a flare of your asthma.  Use the inhaler at home every 4 hours as needed.  Follow-up with your regular doctor.  Please return to the Emergency Department if you experience any worsening of your condition.   Thank you for allowing [place] to be a part of your care.
